# Patient Record
Sex: Female | Born: 2000
Health system: Southern US, Community
[De-identification: ages and names within clinical notes are randomized; demographics above are authoritative.]

## PROBLEM LIST (undated history)

## (undated) ENCOUNTER — Ambulatory Visit (HOSPITAL_COMMUNITY): Admission: EM | Payer: Self-pay | Source: Home / Self Care

## (undated) ENCOUNTER — Ambulatory Visit: Source: Home / Self Care

## (undated) ENCOUNTER — Inpatient Hospital Stay (HOSPITAL_COMMUNITY): Payer: Self-pay

## (undated) DIAGNOSIS — J302 Other seasonal allergic rhinitis: Secondary | ICD-10-CM

## (undated) DIAGNOSIS — Z113 Encounter for screening for infections with a predominantly sexual mode of transmission: Secondary | ICD-10-CM

## (undated) DIAGNOSIS — N939 Abnormal uterine and vaginal bleeding, unspecified: Secondary | ICD-10-CM

## (undated) DIAGNOSIS — B373 Candidiasis of vulva and vagina: Secondary | ICD-10-CM

## (undated) HISTORY — DX: Abnormal uterine and vaginal bleeding, unspecified: N93.9

## (undated) HISTORY — PX: APPENDECTOMY: SHX54

## (undated) HISTORY — DX: Candidiasis of vulva and vagina: B37.3

## (undated) HISTORY — DX: Encounter for screening for infections with a predominantly sexual mode of transmission: Z11.3

---
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
## 2018-01-16 ENCOUNTER — Other Ambulatory Visit: Payer: Self-pay

## 2018-01-16 ENCOUNTER — Ambulatory Visit (INDEPENDENT_AMBULATORY_CARE_PROVIDER_SITE_OTHER): Payer: Medicaid Other | Admitting: Family Medicine

## 2018-01-16 ENCOUNTER — Other Ambulatory Visit (HOSPITAL_COMMUNITY)
Admission: RE | Admit: 2018-01-16 | Discharge: 2018-01-16 | Disposition: A | Discharge status: Home / Self Care | Payer: Medicaid Other | Source: Ambulatory Visit | Attending: Family Medicine | Admitting: Family Medicine

## 2018-01-16 VITALS — BP 100/56 | HR 94 | Temp 98.7°F | Ht 62.0 in | Wt 108.0 lb

## 2018-01-16 DIAGNOSIS — Z3009 Encounter for other general counseling and advice on contraception: Secondary | ICD-10-CM

## 2018-01-16 DIAGNOSIS — Z113 Encounter for screening for infections with a predominantly sexual mode of transmission: Secondary | ICD-10-CM

## 2018-01-16 LAB — POCT WET PREP (WET MOUNT)
Clue Cells Wet Prep Whiff POC: NEGATIVE
TRICHOMONAS WET PREP HPF POC: ABSENT

## 2018-01-16 LAB — POCT URINE PREGNANCY: PREG TEST UR: NEGATIVE

## 2018-01-16 NOTE — Patient Instructions (Signed)
It was a pleasure to see you today! Thank you for choosing [facility] for your primary care. [name] was seen for STD/pregnancy check. Come back to the clinic if you have any routine needs, and go to the emergency room if you have any life threatening symptoms.  Today we ran some checks for infection/pregnancy and gave you some free condoms.  We highly suggest birth control if your goal is not to become pregnant while sexually active.  If we did any lab work today that did not result today, one of two things will happen.  1. If everything is normal, you will get a letter in mail sent to the address in your chart with the results for your records.  It is important to keep your address up to date as that is where we will send results.  2. If the results require some sort of discussion, my nurses or myself will call you on the phone number listed in your records.  It is important to keep your phone number up to date in our system as this is how we will try to reach you.  If we cannot reach you on the phone, we will try to send you a letter in the mail so please enable to voicemail function of your phone.  If you don't hear from [name] in two weeks, please give [name] a call to verify your results. Otherwise, we look forward to seeing you again at your next visit. If you have any questions or concerns before then, please call the clinic at [phone].   Please bring all your medications to every doctors visit   Sign up for My Chart to have easy access to your labs results, and communication with your Primary care physician.     Please check-out at the front desk before leaving the clinic.     Best,  Dr. Marthenia Rolling FAMILY MEDICINE RESIDENT - PGY2 01/16/2018 3:40 PM

## 2018-01-16 NOTE — Progress Notes (Signed)
    Subjective:  [name] is a 17 y.o. female who presents to the [facility] today with a chief complaint of wanting pregnancy test.    HPI: Patient with intermittent barrier protection since stopping depo a few months ago.  Asks about some tenderness in her breasts and wants a pregnancy test.  During our discussion she declined blood HIV but had asked for GC/Chlamydia testing.  She declined to select a BC option but said she was thinking about it and would schedule an appt to discuss again in the future.  She is safe at home, no claim of new rashes/lesions/discharge/bleeding.  She did accept offer of free condoms   Objective:  Physical Exam: *GU performed with CMA, April present at all times BP (!) 100/56   Pulse 94   Temp 98.7 F (37.1 C) (Oral)   Ht 5\' 2"  (1.575 m)   Wt 108 lb (49 kg)   SpO2 99%   BMI 19.75 kg/m   Gen: NAD, resting comfortably CV: RRR with no murmurs appreciated Pulm: NWOB, CTAB with no crackles, wheezes, or rhonchi GI: . Soft, Nontender, Nondistended. MSK: no edema, cyanosis, or clubbing noted GU: no lesions/sores noted, no abnormal discharge, visualized portion of cervix without gross abnormality Skin: warm, dry Neuro: grossly normal, moves all extremities Psych: Normal affect and thought content  Results for orders placed or performed in visit on 01/16/18 (from the past 72 hour(s))  Cervicovaginal ancillary only     Status: None   Collection Time: 01/16/18 12:00 AM  Result Value Ref Range   Chlamydia Negative     Comment: Normal Reference Range - Negative   Neisseria gonorrhea Negative     Comment: Normal Reference Range - Negative  POCT urine pregnancy     Status: None   Collection Time: 01/16/18  3:30 PM  Result Value Ref Range   Preg Test, Ur Negative Negative  POCT Wet Prep [name])     Status: Abnormal   Collection Time: 01/16/18  3:32 PM  Result Value Ref Range   Source Wet Prep POC VAG    WBC, Wet Prep HPF POC NONE    Bacteria Wet Prep HPF  POC Moderate (A) Few   Clue Cells Wet Prep HPF POC None None   Clue Cells Wet Prep Whiff POC Negative Whiff    Yeast Wet Prep HPF POC None None   KOH Wet Prep POC None None   Trichomonas Wet Prep HPF POC Absent Absent     Assessment/Plan:  Screen for STD (sexually transmitted disease) Patient with only intermittent barrier protection, no symptoms. Advised to consistentyl use barrier protection and free condoms given.  Patient declined blood labs  GC/Chlamydia neg, wet prep neg  Contraception management Patient off depo a few months ago, now using intermittent barrier protection.  Declined to select LARC method today but was advised and agreed to try and reschedule appt in the future to discuss further. She did take free condoms today  upreg neg   Marthenia Rolling, DO FAMILY MEDICINE RESIDENT - PGY2 01/18/2018 7:36 AM

## 2018-01-17 LAB — CERVICOVAGINAL ANCILLARY ONLY
CHLAMYDIA, DNA PROBE: NEGATIVE
NEISSERIA GONORRHEA: NEGATIVE

## 2018-01-18 ENCOUNTER — Telehealth: Payer: Self-pay | Admitting: Family Medicine

## 2018-01-18 ENCOUNTER — Telehealth: Payer: Self-pay

## 2018-01-18 NOTE — Assessment & Plan Note (Signed)
Patient with only intermittent barrier protection, no symptoms. Advised to consistentyl use barrier protection and free condoms given.  Patient declined blood labs  GC/Chlamydia neg, wet prep neg

## 2018-01-18 NOTE — Telephone Encounter (Signed)
-----   Message from Marthenia Rolling, DO sent at 01/18/2018  7:26 AM EDT ----- Please tell patient tests were all negative (phone message is ok per patient) and there is no other treatment required.  Please also ask her if she would like to set an appt with any of the doctors here to discuss birth control.

## 2018-01-18 NOTE — Telephone Encounter (Signed)
Spoke to pt and informed her all test were negative. Told her no other treatment is needed. I asked her if she would like to set an appt to discuss birth control, pt said she would call back and schedule an appt. Sunday Spillers, CMA

## 2018-01-18 NOTE — Telephone Encounter (Signed)
She was here on Monday and forgot to get a note for work just saying she was here for an appointment. She will pick it up when ready (please call her at [phone])

## 2018-01-18 NOTE — Assessment & Plan Note (Signed)
Patient off depo a few months ago, now using intermittent barrier protection.  Declined to select LARC method today but was advised and agreed to try and reschedule appt in the future to discuss further. She did take free condoms today  upreg neg

## 2018-01-19 ENCOUNTER — Encounter: Payer: Self-pay | Admitting: Family Medicine

## 2018-01-25 ENCOUNTER — Ambulatory Visit (INDEPENDENT_AMBULATORY_CARE_PROVIDER_SITE_OTHER): Payer: Medicaid Other | Admitting: Family Medicine

## 2018-01-25 ENCOUNTER — Encounter: Payer: Self-pay | Admitting: Family Medicine

## 2018-01-25 ENCOUNTER — Other Ambulatory Visit: Payer: Self-pay

## 2018-01-25 VITALS — BP 92/58 | HR 65 | Temp 98.2°F | Ht 62.0 in | Wt 105.2 lb

## 2018-01-25 DIAGNOSIS — K529 Noninfective gastroenteritis and colitis, unspecified: Secondary | ICD-10-CM

## 2018-01-25 MED ORDER — ONDANSETRON HCL 4 MG PO TABS: 4.0000 mg | ORAL_TABLET | Freq: Three times a day (TID) | ORAL | 0 refills | Status: DC | PRN

## 2018-01-26 ENCOUNTER — Encounter: Payer: Self-pay | Admitting: Family Medicine

## 2018-01-26 NOTE — Progress Notes (Signed)
    CHIEF COMPLAINT / HPI: 2-1/2 days of nausea vomiting with some diarrhea.  Acute onset. Noted after she ate at the sub-shop.  Had acute onset of vomiting.  No blood in her emesis.  Had some mild diarrhea.  No blood in her stool.  Start feeling better yesterday evening and this morning.  Still feels a little weak but no more nausea. REVIEW OF SYSTEMS:  See HPI PERTINENT  PMH / PSH: I have reviewed the patient's medications, allergies, past medical and surgical history, smoking status and updated in the EMR as appropriate.   OBJECTIVE:  Vital signs reviewed. GENERAL: Well-developed, well-nourished, no acute distress. CARDIOVASCULAR: Regular rate and rhythm no murmur gallop or rub LUNGS: Clear to auscultation bilaterally, no rales or wheeze. ABDOMEN: Soft positive bowel sounds NEURO: No gross focal neurological deficits. MSK: Movement of extremity x 4.    ASSESSMENT / PLAN:  #1.  Gastroenteritis, resolving.  We will give her some ondansetron for the mild remaining nausea.  Note for work.  Discussed easing back into diet and continued hydration.  Follow-up PRN.

## 2018-03-15 ENCOUNTER — Ambulatory Visit: Payer: Medicaid Other

## 2018-03-17 ENCOUNTER — Ambulatory Visit: Payer: Medicaid Other | Admitting: Family Medicine

## 2018-03-28 ENCOUNTER — Ambulatory Visit: Payer: Medicaid Other | Admitting: Family Medicine

## 2018-04-03 ENCOUNTER — Other Ambulatory Visit: Payer: Self-pay

## 2018-04-03 ENCOUNTER — Ambulatory Visit (INDEPENDENT_AMBULATORY_CARE_PROVIDER_SITE_OTHER): Payer: BLUE CROSS/BLUE SHIELD | Admitting: Family Medicine

## 2018-04-03 ENCOUNTER — Encounter: Payer: Self-pay | Admitting: Family Medicine

## 2018-04-03 VITALS — BP 100/60 | HR 75 | Temp 98.2°F | Wt 111.0 lb

## 2018-04-03 DIAGNOSIS — Z3009 Encounter for other general counseling and advice on contraception: Secondary | ICD-10-CM

## 2018-04-03 NOTE — Patient Instructions (Signed)
It was a pleasure to see you today! Thank you for choosing [facility] for your primary care. [name] was seen for contreception.  Schedule a return visit for urine pregnancy test. When I negative result, I will prescribe the patch. Between now and then, use condoms and the Emergency Contraceptive.   Best,  Thomes DinningBrad Thompson, MD, MS FAMILY MEDICINE RESIDENT - PGY1 04/03/2018 4:29 PM

## 2018-04-03 NOTE — Progress Notes (Signed)
Established Patient Office Visit  Subjective:  Patient ID: [name], female    DOB: 08/05/2000  Age: 17 y.o. MRN: [id number]  CC: No chief complaint on file.   HPI Cerinity Bice presents to discuss contraception. Previously on depo in May, had irregular periods on the depo. Her last period is the heaviest she has had since before starting Depo-Provera.  Patient is wishes to have contraception to control her menstrual cycle.  She says that she is often very forgetful and does not want to try to use the pill.  She is not interested in IUD or Nexplanon.  She is interested in trying the patch.    Past Medical History:  Diagnosis Date  . Screen for STD (sexually transmitted disease) 02/28/2017  . Seasonal allergies   . Vaginal bleeding 09/17/2016  . Vulvovaginal candidiasis 11/15/2014    Past Surgical History:  Procedure Laterality Date  . LAPAROSCOPIC APPENDECTOMY N/A 11/08/2014   Procedure: APPENDECTOMY LAPAROSCOPIC;  Surgeon: Leonia Corona, MD;  Location: [facility] OR;  Service: Pediatrics;  Laterality: N/A;    Family History  Problem Relation Age of Onset  . Hypertension Mother   . Asthma Sister   . Hypertension Maternal Grandmother   . Arthritis Maternal Grandmother   . Arthritis Maternal Grandfather     Social History   Socioeconomic History  . Marital status: Single    Spouse name: Not on file  . Number of children: Not on file  . Years of education: Not on file  . Highest education level: Not on file  Occupational History  . Not on file  Social Needs  . Financial resource strain: Not on file  . Food insecurity:    Worry: Not on file    Inability: Not on file  . Transportation needs:    Medical: Not on file    Non-medical: Not on file  Tobacco Use  . Smoking status: Never Smoker  . Smokeless tobacco: Never Used  Substance and Sexual Activity  . Alcohol use: No  . Drug use: No  . Sexual activity: Never    Birth control/protection: Abstinence  Lifestyle  .  Physical activity:    Days per week: Not on file    Minutes per session: Not on file  . Stress: Not on file  Relationships  . Social connections:    Talks on phone: Not on file    Gets together: Not on file    Attends religious service: Not on file    Active member of club or organization: Not on file    Attends meetings of clubs or organizations: Not on file    Relationship status: Not on file  . Intimate partner violence:    Fear of current or ex partner: Not on file    Emotionally abused: Not on file    Physically abused: Not on file    Forced sexual activity: Not on file  Other Topics Concern  . Not on file  Social History Narrative  . Not on file    Outpatient Medications Prior to Visit  Medication Sig Dispense Refill  . cetirizine (ZYRTEC) 10 MG tablet Take 1 tablet (10 mg total) by mouth daily as needed for allergies. 30 tablet 3  . fluticasone (FLONASE) 50 MCG/ACT nasal spray Place 2 sprays into both nostrils daily. (Patient taking differently: Place 2 sprays into both nostrils daily as needed. ) 16 g 6  . ondansetron (ZOFRAN) 4 MG tablet Take 1 tablet (4 mg total) by mouth every  8 (eight) hours as needed for nausea or vomiting. 20 tablet 0   No facility-administered medications prior to visit.     Allergies  Allergen Reactions  . Penicillins Rash  . Amoxicillin   . Tramadol     ROS Review of Systems  All other systems reviewed and are negative.     Objective:    Physical Exam  Constitutional: She is oriented to person, place, and time. She appears well-developed and well-nourished. No distress.  HENT:  Head: Normocephalic and atraumatic.  Eyes: Conjunctivae are normal. No scleral icterus.  Neck: No JVD present.  Cardiovascular: Normal rate.  Pulmonary/Chest: Effort normal.  Neurological: She is alert and oriented to person, place, and time.  Skin: Skin is dry.  Psychiatric: She has a normal mood and affect. Her behavior is normal.    BP 100/60    Pulse 75   Temp 98.2 F (36.8 C) (Oral)   Wt 111 lb (50.3 kg)   LMP 03/29/2018   SpO2 99%   BMI 20.30 kg/m  Wt Readings from Last 3 Encounters:  04/03/18 111 lb (50.3 kg) (18 %, Z= -0.90)*  01/25/18 105 lb 3.2 oz (47.7 kg) (10 %, Z= -1.31)*  01/16/18 108 lb (49 kg) (14 %, Z= -1.09)*   * Growth percentiles are based on CDC (Girls, 2-20 Years) data.     Health Maintenance Due  Topic Date Due  . INFLUENZA VACCINE  12/08/2017  . TETANUS/TDAP  02/23/2018    There are no preventive care reminders to display for this patient.  No results found for: TSH Lab Results  Component Value Date   WBC 13.5 11/08/2014   HGB 13.6 11/08/2014   HCT 39.7 11/08/2014   MCV 84.3 11/08/2014   PLT 292 11/08/2014   Lab Results  Component Value Date   NA 138 11/08/2014   K 3.9 11/08/2014   CO2 24 11/08/2014   GLUCOSE 90 11/08/2014   BUN 11 11/08/2014   CREATININE 0.79 11/08/2014   BILITOT 1.0 11/08/2014   ALKPHOS 64 11/08/2014   AST 21 11/08/2014   ALT 11 (L) 11/08/2014   PROT 7.9 11/08/2014   ALBUMIN 4.1 11/08/2014   CALCIUM 9.9 11/08/2014   ANIONGAP 12 11/08/2014   No results found for: CHOL No results found for: HDL No results found for: LDLCALC No results found for: TRIG No results found for: CHOLHDL No results found for: [id number]    Assessment & Plan:  Patient wishes to discuss contraception.  However we do not have any pregnancy test available at the clinic today.  Discussed with patient her options.  She will come back to clinic at her convenience to complete a urine pregnancy test within the next week and when it results negative I will prescribe her the patch.  Discussed other forms of birth control in the interim including condoms and emergency contraception.  Patient is in agreement with this plan. Problem List Items Addressed This Visit      Other   Contraception management - Primary   Relevant Orders   POCT urine pregnancy      No orders of the defined types were  placed in this encounter.   Follow-up: No follow-ups on file.    Garnette GunnerAaron B Imanol Bihl, MD

## 2018-04-04 ENCOUNTER — Encounter: Payer: Self-pay | Admitting: Family Medicine

## 2018-04-04 ENCOUNTER — Other Ambulatory Visit (INDEPENDENT_AMBULATORY_CARE_PROVIDER_SITE_OTHER): Payer: BLUE CROSS/BLUE SHIELD

## 2018-04-04 ENCOUNTER — Other Ambulatory Visit: Payer: Self-pay | Admitting: *Deleted

## 2018-04-04 DIAGNOSIS — Z3202 Encounter for pregnancy test, result negative: Secondary | ICD-10-CM | POA: Diagnosis not present

## 2018-04-04 DIAGNOSIS — Z3009 Encounter for other general counseling and advice on contraception: Secondary | ICD-10-CM

## 2018-04-04 LAB — POCT URINE PREGNANCY: Preg Test, Ur: NEGATIVE

## 2018-04-04 MED ORDER — NORELGESTROMIN-ETH ESTRADIOL 150-35 MCG/24HR TD PTWK: 1.0000 | MEDICATED_PATCH | TRANSDERMAL | 12 refills | Status: DC

## 2018-04-04 NOTE — Telephone Encounter (Signed)
Phoned in Rx of Ortho Evra per Dr. Janee Mornhompson. [name], [name], [place]

## 2018-04-04 NOTE — Addendum Note (Signed)
Addended by: Fanny BienHOMPSON, Xee Hollman B on: 04/04/2018 12:31 PM   Modules accepted: Orders

## 2018-04-04 NOTE — Progress Notes (Signed)
upreg neg. Will rx bc patch.

## 2018-04-04 NOTE — Progress Notes (Signed)
Pt had neg upreg. Will Rx Ortho evra patch.

## 2018-04-05 ENCOUNTER — Telehealth: Payer: Self-pay

## 2018-04-05 NOTE — Telephone Encounter (Signed)
[facility] Xulane patch. Clinical info submitted via CoverMyMeds. Response may take up to 72 hours.  Key # [id number]  Ples SpecterAlisa , RN [facility] RN)

## 2018-04-26 ENCOUNTER — Ambulatory Visit: Payer: BLUE CROSS/BLUE SHIELD | Admitting: Family Medicine

## 2018-05-18 ENCOUNTER — Ambulatory Visit (INDEPENDENT_AMBULATORY_CARE_PROVIDER_SITE_OTHER): Payer: BLUE CROSS/BLUE SHIELD | Admitting: Student in an Organized Health Care Education/Training Program

## 2018-05-18 ENCOUNTER — Other Ambulatory Visit (HOSPITAL_COMMUNITY)
Admission: RE | Admit: 2018-05-18 | Discharge: 2018-05-18 | Disposition: A | Discharge status: Home / Self Care | Payer: BLUE CROSS/BLUE SHIELD | Source: Ambulatory Visit | Attending: Family Medicine | Admitting: Family Medicine

## 2018-05-18 ENCOUNTER — Other Ambulatory Visit: Payer: Self-pay

## 2018-05-18 VITALS — BP 96/64 | HR 80 | Temp 98.6°F | Ht 62.0 in | Wt 114.8 lb

## 2018-05-18 DIAGNOSIS — N760 Acute vaginitis: Secondary | ICD-10-CM | POA: Insufficient documentation

## 2018-05-18 DIAGNOSIS — N898 Other specified noninflammatory disorders of vagina: Secondary | ICD-10-CM | POA: Insufficient documentation

## 2018-05-18 DIAGNOSIS — B9689 Other specified bacterial agents as the cause of diseases classified elsewhere: Secondary | ICD-10-CM | POA: Diagnosis not present

## 2018-05-18 DIAGNOSIS — Z3202 Encounter for pregnancy test, result negative: Secondary | ICD-10-CM

## 2018-05-18 LAB — POCT WET PREP (WET MOUNT)
Clue Cells Wet Prep Whiff POC: POSITIVE
Trichomonas Wet Prep HPF POC: ABSENT

## 2018-05-18 LAB — POCT URINE PREGNANCY: Preg Test, Ur: NEGATIVE

## 2018-05-18 MED ORDER — METRONIDAZOLE 500 MG PO TABS: 500.0000 mg | ORAL_TABLET | Freq: Two times a day (BID) | ORAL | 0 refills | Status: DC

## 2018-05-18 NOTE — Assessment & Plan Note (Signed)
1. Bacterial vaginosis Wet prep positive for few yeast and BV. Will treat BV. If symptoms do not improve over the next week, patient to call back and would prescribe diflucan for yeast. Upreg negative. STI screening pending with GC/Chla. Patient refuses RPR and HIV. - metroNIDAZOLE (FLAGYL) 500 MG tablet; Take 1 tablet (500 mg total) by mouth 2 (two) times daily.  Dispense: 14 tablet; Refill: 0

## 2018-05-18 NOTE — Patient Instructions (Signed)
It was a pleasure seeing you today in our clinic.  Here is the treatment plan we have discussed and agreed upon together:  Please complete 1 week course of antibiotics.  If your symptoms worsen or fail to improve, give [name] a call.  We tested you for gonorrhea and chlamydia at today's visit. I will call or send you a letter with these results. If you do not hear from me within the next week, please give our office a call.  Our clinic's number is [phone]. Please call with questions or concerns about what we discussed today.  Be well, Dr. Mosetta Putt

## 2018-05-18 NOTE — Progress Notes (Signed)
   CC: vaginal discharge  HPI: [name] is a 18 y.o. female   VAGINAL DISCHARGE  Having vaginal discharge for 2-3 days. Discharge consistency: clumpy Discharge color: white Medications tried: none  Recent antibiotic use: none Sex in last month: yes Possible STD exposure:no, but would like testing  Symptoms Fever: no Dysuria:no Vaginal bleeding: last meneses started 12/23 Abdomen or Pelvic pain: no Back pain: no Genital sores or ulcers:no Rash: no Pain during sex: no Missed menstrual period: regular periods  ROS see HPI Smoking Status noted   Review of Symptoms:  See HPI for ROS.   CC, SH/smoking status, and VS noted.  Objective: BP 96/64   Pulse 80   Temp 98.6 F (37 C) (Oral)   Ht 5\' 2"  (1.575 m)   Wt 114 lb 12.8 oz (52.1 kg)   LMP 05/01/2018 (Approximate)   SpO2 99%   BMI 21.00 kg/m  GEN: NAD, alert, cooperative, and pleasant. RESPIRATORY: Comfortable work of breathing, speaks in full sentences CV: Regular rate noted, distal extremities well perfused and warm without edema GI: Soft, nondistended SKIN: warm and dry, no rashes or lesions NEURO: II-XII grossly intact MSK: Moves 4 extremities equally PSYCH: AAOx3, appropriate affect Female genitalia: normal external genitalia, vulva, vagina, cervix, uterus and adnexa, thin white discharge, Cervix is very posterior  Assessment and plan:  Bacterial vaginosis 1. Bacterial vaginosis Wet prep positive for few yeast and BV. Will treat BV. If symptoms do not improve over the next week, patient to call back and would prescribe diflucan for yeast. Upreg negative. STI screening pending with GC/Chla. Patient refuses RPR and HIV. - metroNIDAZOLE (FLAGYL) 500 MG tablet; Take 1 tablet (500 mg total) by mouth 2 (two) times daily.  Dispense: 14 tablet; Refill: 0   Orders Placed This Encounter  Procedures  . POCT Wet Prep [employer])  . POCT urine pregnancy    Meds ordered this encounter  Medications  .  metroNIDAZOLE (FLAGYL) 500 MG tablet    Sig: Take 1 tablet (500 mg total) by mouth 2 (two) times daily.    Dispense:  14 tablet    Refill:  0     Howard Pouch, MD,MS,  PGY3 05/18/2018 11:23 AM

## 2018-05-19 LAB — CERVICOVAGINAL ANCILLARY ONLY
CHLAMYDIA, DNA PROBE: NEGATIVE
Neisseria Gonorrhea: NEGATIVE

## 2018-06-14 ENCOUNTER — Telehealth: Payer: Self-pay

## 2018-06-14 NOTE — Telephone Encounter (Signed)
Patient called and stated her BV symptoms got better after course of Flagyl for about 3-4 days and then came back.  OV note mentions if symptoms return, would call in Diflucan for yeast.  Describes symptoms as greenish white discharge, no itching or burning, some odor. Worse after intercourse.   Rx or office visit?  Call back is [phone]

## 2018-06-16 NOTE — Telephone Encounter (Signed)
I would have her come back for another office visit because her symptoms improved with flagyl, it may be that she is still having BV.

## 2018-06-19 NOTE — Telephone Encounter (Signed)
Patient returned call. Appointment made for tomorrow. Ples SpecterAlisa Nataliee Shurtz, RN [facility] RN)

## 2018-06-19 NOTE — Telephone Encounter (Signed)
LVM to call office back to inform her of below and assist in getting her an appointment scheduled. Lamonte Sakai, April D, [place]

## 2018-06-20 ENCOUNTER — Ambulatory Visit: Payer: BLUE CROSS/BLUE SHIELD | Admitting: Family Medicine

## 2018-07-12 ENCOUNTER — Ambulatory Visit
Admission: EM | Admit: 2018-07-12 | Discharge: 2018-07-12 | Disposition: A | Discharge status: Home / Self Care | Payer: BLUE CROSS/BLUE SHIELD | Attending: Physician Assistant | Admitting: Physician Assistant

## 2018-07-12 DIAGNOSIS — R05 Cough: Secondary | ICD-10-CM

## 2018-07-12 DIAGNOSIS — R0981 Nasal congestion: Secondary | ICD-10-CM | POA: Diagnosis not present

## 2018-07-12 DIAGNOSIS — R6889 Other general symptoms and signs: Secondary | ICD-10-CM

## 2018-07-12 DIAGNOSIS — J3489 Other specified disorders of nose and nasal sinuses: Secondary | ICD-10-CM

## 2018-07-12 DIAGNOSIS — R110 Nausea: Secondary | ICD-10-CM

## 2018-07-12 DIAGNOSIS — R51 Headache: Secondary | ICD-10-CM

## 2018-07-12 MED ORDER — IPRATROPIUM BROMIDE 0.06 % NA SOLN: 2.0000 | Freq: Four times a day (QID) | NASAL | 12 refills | Status: DC

## 2018-07-12 MED ORDER — PREDNISONE 50 MG PO TABS: 50.0000 mg | ORAL_TABLET | Freq: Every day | ORAL | 0 refills | Status: DC

## 2018-07-12 MED ORDER — BENZONATATE 100 MG PO CAPS: 100.0000 mg | ORAL_CAPSULE | Freq: Three times a day (TID) | ORAL | 0 refills | Status: DC

## 2018-07-12 NOTE — ED Provider Notes (Signed)
[facility]    CSN: [id number] Arrival date & time: 07/12/18  1341     History   Chief Complaint Chief Complaint  Patient presents with  . Influenza    HPI [name] is a 18 y.o. female.   18 year old female comes in for 3-day history of URI symptoms.  Has had rhinorrhea, nasal congestion, nausea, headache, sneezing, cough, body aches, chills.  No obvious fever.  Denies vomiting, abdominal pain.  Has had a few episodes of loose stools.  Still been able to tolerate fluids without difficulty.  OTC cold medication without relief.  Never smoker.  Positive sick contact.     Past Medical History:  Diagnosis Date  . Screen for STD (sexually transmitted disease) 02/28/2017  . Seasonal allergies   . Vaginal bleeding 09/17/2016  . Vulvovaginal candidiasis 11/15/2014    Patient Active Problem List   Diagnosis Date Noted  . Bacterial vaginosis 05/18/2018  . Contraception management 11/28/2017  . Vaginal discharge 09/30/2017  . High risk sexual behavior 03/10/2017  . Screen for STD (sexually transmitted disease) 02/28/2017    Past Surgical History:  Procedure Laterality Date  . LAPAROSCOPIC APPENDECTOMY N/A 11/08/2014   Procedure: APPENDECTOMY LAPAROSCOPIC;  Surgeon: Leonia Corona, MD;  Location: [facility] OR;  Service: Pediatrics;  Laterality: N/A;    OB History   No obstetric history on file.      Home Medications    Prior to Admission medications   Medication Sig Start Date End Date Taking? Authorizing Provider  benzonatate (TESSALON) 100 MG capsule Take 1 capsule (100 mg total) by mouth every 8 (eight) hours. 07/12/18   Cathie Hoops, Khelani Kops V, PA-C  cetirizine (ZYRTEC) 10 MG tablet Take 1 tablet (10 mg total) by mouth daily as needed for allergies. 11/25/17   Latrelle Dodrill, MD  ipratropium (ATROVENT) 0.06 % nasal spray Place 2 sprays into both nostrils 4 (four) times daily. 07/12/18   Cathie Hoops, Brianny Soulliere V, PA-C  norelgestromin-ethinyl estradiol (ORTHO EVRA) 150-35 MCG/24HR  transdermal patch Place 1 patch onto the skin once a week. 04/04/18   Garnette Gunner, MD  ondansetron (ZOFRAN) 4 MG tablet Take 1 tablet (4 mg total) by mouth every 8 (eight) hours as needed for nausea or vomiting. 01/25/18   Nestor Ramp, MD  predniSONE (DELTASONE) 50 MG tablet Take 1 tablet (50 mg total) by mouth daily with breakfast. 07/12/18   Belinda Fisher, PA-C    Family History Family History  Problem Relation Age of Onset  . Hypertension Mother   . Asthma Sister   . Hypertension Maternal Grandmother   . Arthritis Maternal Grandmother   . Arthritis Maternal Grandfather     Social History Social History   Tobacco Use  . Smoking status: Never Smoker  . Smokeless tobacco: Never Used  Substance Use Topics  . Alcohol use: No  . Drug use: No     Allergies   Penicillins; Amoxicillin; and Tramadol   Review of Systems Review of Systems  Reason unable to perform ROS: See HPI as above.     Physical Exam Triage Vital Signs ED Triage Vitals  Enc Vitals Group     BP 07/12/18 1352 (!) 157/85     Pulse Rate 07/12/18 1352 81     Resp 07/12/18 1352 18     Temp 07/12/18 1352 98.1 F (36.7 C)     Temp Source 07/12/18 1352 Oral     SpO2 07/12/18 1352 98 %     Weight --  Height --      Head Circumference --      Peak Flow --      Pain Score 07/12/18 1353 10     Pain Loc --      Pain Edu? --      Excl. in GC? --    No data found.  Updated Vital Signs BP (!) 157/85 (BP Location: Left Arm)   Pulse 81   Temp 98.1 F (36.7 C) (Oral)   Resp 18   LMP 07/09/2018   SpO2 98%   Physical Exam Constitutional:      General: She is not in acute distress.    Appearance: She is well-developed. She is not ill-appearing, toxic-appearing or diaphoretic.  HENT:     Head: Normocephalic and atraumatic.     Right Ear: Tympanic membrane, ear canal and external ear normal. Tympanic membrane is not erythematous or bulging.     Left Ear: Tympanic membrane, ear canal and external ear  normal. Tympanic membrane is not erythematous or bulging.     Nose: Rhinorrhea present.     Right Sinus: Maxillary sinus tenderness and frontal sinus tenderness present.     Left Sinus: Maxillary sinus tenderness and frontal sinus tenderness present.     Mouth/Throat:     Mouth: Mucous membranes are moist.     Pharynx: Oropharynx is clear. Uvula midline.  Eyes:     Conjunctiva/sclera: Conjunctivae normal.     Pupils: Pupils are equal, round, and reactive to light.  Neck:     Musculoskeletal: Normal range of motion and neck supple.  Cardiovascular:     Rate and Rhythm: Normal rate and regular rhythm.     Heart sounds: Normal heart sounds. No murmur. No friction rub. No gallop.   Pulmonary:     Effort: Pulmonary effort is normal. No accessory muscle usage, prolonged expiration, respiratory distress or retractions.     Breath sounds: Normal breath sounds. No stridor, decreased air movement or transmitted upper airway sounds. No decreased breath sounds, wheezing, rhonchi or rales.  Skin:    General: Skin is warm and dry.  Neurological:     Mental Status: She is alert and oriented to person, place, and time.      UC Treatments / Results  Labs (all labs ordered are listed, but only abnormal results are displayed) Labs Reviewed - No data to display  EKG None  Radiology No results found.  Procedures Procedures (including critical care time)  Medications Ordered in UC Medications - No data to display  Initial Impression / Assessment and Plan / UC Course  I have reviewed the triage vital signs and the nursing notes.  Pertinent labs & imaging results that were available during my care of the patient were reviewed by me and considered in my medical decision making (see chart for details).    Discussed flulike symptoms outside of treatment range of Tamiflu.  Will provide symptomatic treatment.  Prednisone as directed for sinus pressure.  Other symptomatic treatment discussed.   Offered Zofran given complaint of nausea, patient deferred for now stating able to tolerate fluids without difficulty.  Push fluids.  Return precautions given.  Patient expresses understanding and agrees to plan.  Final Clinical Impressions(s) / UC Diagnoses   Final diagnoses:  Flu-like symptoms    ED Prescriptions    Medication Sig Dispense Auth. Provider   predniSONE (DELTASONE) 50 MG tablet Take 1 tablet (50 mg total) by mouth daily with breakfast. 5 tablet Belinda Fisher,  PA-C   ipratropium (ATROVENT) 0.06 % nasal spray Place 2 sprays into both nostrils 4 (four) times daily. 15 mL Choua Chalker V, PA-C   benzonatate (TESSALON) 100 MG capsule Take 1 capsule (100 mg total) by mouth every 8 (eight) hours. 21 capsule Threasa Alpha, [place] 07/12/18 1434

## 2018-07-12 NOTE — ED Triage Notes (Signed)
Pt c/o headaches, body aches, chills and cough x2days

## 2018-07-12 NOTE — Discharge Instructions (Signed)
Tessalon for cough. Prednisone for sinus pressure. Start atrovent nasal spray for nasal congestion/drainage. You can use over the counter nasal saline rinse such as neti pot for nasal congestion. Keep hydrated, your urine should be clear to pale yellow in color. Tylenol/motrin for fever and pain. Monitor for any worsening of symptoms, chest pain, shortness of breath, wheezing, swelling of the throat, follow up for reevaluation.

## 2018-12-18 ENCOUNTER — Other Ambulatory Visit (HOSPITAL_COMMUNITY)
Admission: RE | Admit: 2018-12-18 | Discharge: 2018-12-18 | Disposition: A | Discharge status: Home / Self Care | Payer: Medicaid Other | Source: Ambulatory Visit | Attending: Family Medicine | Admitting: Family Medicine

## 2018-12-18 ENCOUNTER — Ambulatory Visit (INDEPENDENT_AMBULATORY_CARE_PROVIDER_SITE_OTHER): Payer: Self-pay | Admitting: Family Medicine

## 2018-12-18 ENCOUNTER — Encounter: Payer: Self-pay | Admitting: Family Medicine

## 2018-12-18 ENCOUNTER — Other Ambulatory Visit: Payer: Self-pay

## 2018-12-18 VITALS — BP 102/72 | HR 96 | Wt 111.0 lb

## 2018-12-18 DIAGNOSIS — R109 Unspecified abdominal pain: Secondary | ICD-10-CM

## 2018-12-18 DIAGNOSIS — Z113 Encounter for screening for infections with a predominantly sexual mode of transmission: Secondary | ICD-10-CM | POA: Diagnosis not present

## 2018-12-18 DIAGNOSIS — B3731 Acute candidiasis of vulva and vagina: Secondary | ICD-10-CM

## 2018-12-18 DIAGNOSIS — K5900 Constipation, unspecified: Secondary | ICD-10-CM

## 2018-12-18 DIAGNOSIS — B373 Candidiasis of vulva and vagina: Secondary | ICD-10-CM

## 2018-12-18 DIAGNOSIS — Z30016 Encounter for initial prescription of transdermal patch hormonal contraceptive device: Secondary | ICD-10-CM

## 2018-12-18 DIAGNOSIS — N912 Amenorrhea, unspecified: Secondary | ICD-10-CM

## 2018-12-18 LAB — POCT WET PREP (WET MOUNT)
Clue Cells Wet Prep Whiff POC: NEGATIVE
Trichomonas Wet Prep HPF POC: ABSENT

## 2018-12-18 LAB — POCT URINE PREGNANCY: Preg Test, Ur: NEGATIVE

## 2018-12-18 MED ORDER — NORELGESTROMIN-ETH ESTRADIOL 150-35 MCG/24HR TD PTWK: 1.0000 | MEDICATED_PATCH | TRANSDERMAL | 12 refills | Status: DC

## 2018-12-18 MED ORDER — FLUCONAZOLE 150 MG PO TABS: 150.0000 mg | ORAL_TABLET | Freq: Once | ORAL | 0 refills | Status: AC

## 2018-12-18 NOTE — Progress Notes (Signed)
Subjective:   Patient ID: [name]    DOB: 04/23/2001, 18 y.o. female   MRN: [id number]  [name] is a 18 y.o. female here for abdominal pain.  Abdominal Pain: Left lower abdominal pain x 1 week, particularly during or after intercourse, lasts for ~2 minutes. Nothing she does makes get better or worse. Hard to explain the feeling. Describes it as a dull throbbing pain. Does not feel like cramps. Does note some "upset stomach" with nausea all the time, more sensitive to smells. Denies vomiting. Denies fevers or chills, diarrhea. Denies any vaginal bleeding. Endorses frequent yeast infections but no new abnormal discharge. Just occasional white discharge. Not currently on birth control. Sexually active with one partner x 2 years. Only occasional use of condoms. LMP 10/30/18. Normally has regular periods every month, lasts 7 days, "heavy" - uses 2-3 tampons per day. Notes chronic constipation - last bowel movement yesterday which was hard pellets. She notes she has bowel movement 5-6x/month. Occasional hard stools. Notes decreased appetite. Denies excessive hot/cold, hair loss. Does endorse feeling tired but attributes this to working two jobs. Denies dysuria. She notes she drinks 1 bottle of water and 1 glass of juice a day.  Review of Systems:  Per HPI.   [place], medications and smoking status reviewed.  Objective:   BP: 102/72, pulse 96, weight 111 lb (50.3 kg), last menstrual period 10/30/2018, SpO2 98 %. Vitals and nursing note reviewed.  General: well nourished, well developed, in no acute distress with non-toxic appearance, sitting comfortably on exam bed HEENT: normocephalic, atraumatic, moist mucous membranes CV: regular rate and rhythm without murmurs, rubs, or gallops, no lower extremity edema, 2+ radial and pedal pulses bilaterally Lungs: clear to auscultation bilaterally with normal work of breathing on room air Abdomen: soft, tender to palpation along left lower quadrant and  pelvis otherwise non-tender, non-distended, no masses or organomegaly palpable, able to palpate firm stool in LLQ, minimal bowel sounds throughout Skin: warm, dry, no rashes or lesions Extremities: warm and well perfused Neuro: Alert and oriented, speech normal Pelvic exam: VULVA: normal appearing vulva with no masses, tenderness or lesions, VAGINA: no atrophy or erythema, vaginal discharge - white, copious, odorless and thick, CERVIX: normal appearing cervix without discharge or lesions, UTERUS: uterus is normal size, shape, consistency and nontender, ADNEXA: normal adnexa in size and no masses, tenderness left, WET MOUNT done - results: KOH done, hyphae, exam chaperoned by Delray Alt.  Assessment & Plan:   Abdominal pain in female Acute LLQ pain x 1 week that occurs with sexual intercourse. Differential includes and may be multifactorial: constipation vs left ovarian cyst vs possibly referred pain from current yeast infection. Less likely ruptured ovarian cyst or ovarian torsion given description of pain. Pelvic exam and wet prep notable for yeast infection. No systemic infectious symptoms or signs of PID on exam. Pregnancy test negative. Discussed next steps including treating yeast infection and constipation vs pelvic ultrasound. Patient opted for conservative treatment with plan for ultrasound if no improvement. - Recommend Miralax BID until soft stool then QD thereafter - 48-64 oz of water/day, increase fiber intake - If no improvement in 1 week, RTC for non-OB Pelvic ultrasound with transvaginal  - Return precautions discussed including worsening pain, fevers/chills, abnormal bleeding, or worsening of vaginal discharge  Contraception management Patient currently using intermittent barrier protection. Urine preg test negative. Discussed contraceptive options including Depo vs LARC vs pill vs patch. Patient has used Depo in the past but had unwanted side  effects. Not interested in LARC or  Pills. Patient open to trying transdermal patch. - Rx for Ortho-evra patch - Discussed instructions of use and possible adverse effects including skin sensitivity. Patient understood and agrees to plan.   Vaginal candidiasis Pelvic exam and wet prep notable for yeast infection. Trich and BV negative. Informed patient this is common in sexually active females. Patient reports recurrent and frequent yeast infections with several OTC treatments with Monistat this year. May benefit from maintenance treatment in the future if continues to recur - rx for Diflucan 150mg  x 1  - If continues to recur, consider maintenance treatment: Induction with fluconazole 150mg  q 72 hs x 3 doses, followed by maintenance fluconazole 150 mg once per week for six months - RTC if no improvement or worsening symptoms  Orders Placed This Encounter  Procedures  . POCT Wet Prep [employer](Wet Mount)  . POCT urine pregnancy   Meds ordered this encounter  Medications  . norelgestromin-ethinyl estradiol (ORTHO EVRA) 150-35 MCG/24HR transdermal patch    Sig: Place 1 patch onto the skin once a week.    Dispense:  3 patch    Refill:  12  . fluconazole (DIFLUCAN) 150 MG tablet    Sig: Take 1 tablet (150 mg total) by mouth once for 1 dose.    Dispense:  1 tablet    Refill:  0    Orpah CobbKiersten , DO PGY-2, [facility] 12/19/2018 9:48 AM

## 2018-12-18 NOTE — Patient Instructions (Addendum)
Thank you so much for coming in to see me today.  There may be multiple causes for left lower abdominal pain. I recommend Miralax twice a day until you have a regular soft bowel movement then you can take it once a day. I recommend you drink 4-6 bottles of water a day.   I have also sent in a prescription for Diflucan for your yeast infection.  We also discussed contraception and we decided to try the Ortho Vera Patch. I will call that into your pharmacy. You apply a patch 1 time per week for 3 weeks then take 1 week off. Then start again.  Please follow up in 2 weeks or sooner if no improvement. We can get a pelvic ultrasound at that time.  Thank you and feel free to call the clinic with any concerns.  Take care, Dr. Tarry Kos

## 2018-12-19 ENCOUNTER — Encounter: Payer: Self-pay | Admitting: Family Medicine

## 2018-12-19 DIAGNOSIS — R1084 Generalized abdominal pain: Secondary | ICD-10-CM | POA: Insufficient documentation

## 2018-12-19 NOTE — Assessment & Plan Note (Addendum)
Pelvic exam and wet prep notable for yeast infection. Trich and BV negative. Informed patient this is common in sexually active females. Patient reports recurrent and frequent yeast infections with several OTC treatments with Monistat this year. May benefit from maintenance treatment in the future if continues to recur - rx for Diflucan 150mg  x 1  - If continues to recur, consider maintenance treatment: Induction with fluconazole 150mg  q 72 hs x 3 doses, followed by maintenance fluconazole 150 mg once per week for six months - RTC if no improvement or worsening symptoms

## 2018-12-19 NOTE — Assessment & Plan Note (Signed)
Acute LLQ pain x 1 week that occurs with sexual intercourse. Differential includes and may be multifactorial: constipation vs left ovarian cyst vs possibly referred pain from current yeast infection. Less likely ruptured ovarian cyst or ovarian torsion given description of pain. Pelvic exam and wet prep notable for yeast infection. No systemic infectious symptoms or signs of PID on exam. Pregnancy test negative. Discussed next steps including treating yeast infection and constipation vs pelvic ultrasound. Patient opted for conservative treatment with plan for ultrasound if no improvement. - Recommend Miralax BID until soft stool then QD thereafter - 48-64 oz of water/day, increase fiber intake - If no improvement in 1 week, RTC for non-OB Pelvic ultrasound with transvaginal  - Return precautions discussed including worsening pain, fevers/chills, abnormal bleeding, or worsening of vaginal discharge

## 2018-12-19 NOTE — Assessment & Plan Note (Addendum)
Patient currently using intermittent barrier protection. Urine preg test negative. Discussed contraceptive options including Depo vs LARC vs pill vs patch. Patient has used Depo in the past but had unwanted side effects. Not interested in LARC or Pills. Patient open to trying transdermal patch. - Rx for Ortho-evra patch - Discussed instructions of use and possible adverse effects including skin sensitivity. Patient understood and agrees to plan.

## 2018-12-20 ENCOUNTER — Telehealth: Payer: Self-pay | Admitting: Family Medicine

## 2018-12-20 LAB — CERVICOVAGINAL ANCILLARY ONLY
Chlamydia: NEGATIVE
Neisseria Gonorrhea: NEGATIVE

## 2018-12-20 NOTE — Telephone Encounter (Signed)
Called patient. Having dysuria, still vaginal discharge. Dysuria started yesterday. UA not obtained at visit the other day. Will have her come for appointment to assess. Visit scheduled with me in [facility] clinic tomorrow at [place]. Leeanne Rio, MD

## 2018-12-20 NOTE — Telephone Encounter (Signed)
Pt would like to speak to Dr. Ardelia Mems. She was just seen and was told that if she wasn't feeling better to call the office. jw

## 2018-12-21 ENCOUNTER — Encounter: Payer: Self-pay | Admitting: Family Medicine

## 2018-12-21 ENCOUNTER — Other Ambulatory Visit: Payer: Self-pay

## 2018-12-21 ENCOUNTER — Ambulatory Visit (INDEPENDENT_AMBULATORY_CARE_PROVIDER_SITE_OTHER): Payer: Self-pay | Admitting: Family Medicine

## 2018-12-21 VITALS — BP 90/60 | HR 84

## 2018-12-21 DIAGNOSIS — R300 Dysuria: Secondary | ICD-10-CM

## 2018-12-21 LAB — POCT URINALYSIS DIP (MANUAL ENTRY)
Bilirubin, UA: NEGATIVE
Blood, UA: NEGATIVE
Glucose, UA: NEGATIVE mg/dL
Nitrite, UA: NEGATIVE
Protein Ur, POC: NEGATIVE mg/dL
Spec Grav, UA: >=1.03 — AB (ref 1.010–1.025)
Urobilinogen, UA: 0.2 E.U./dL
pH, UA: 6 (ref 5.0–8.0)

## 2018-12-21 LAB — POCT UA - MICROSCOPIC ONLY: Epithelial cells, urine per micros: >20

## 2018-12-21 MED ORDER — FLUCONAZOLE 150 MG PO TABS: ORAL_TABLET | ORAL | 0 refills | Status: DC

## 2018-12-21 MED ORDER — CETIRIZINE HCL 10 MG PO TABS: 10.0000 mg | ORAL_TABLET | Freq: Every day | ORAL | 3 refills | Status: DC | PRN

## 2018-12-21 MED ORDER — NITROFURANTOIN MONOHYD MACRO 100 MG PO CAPS: 100.0000 mg | ORAL_CAPSULE | Freq: Two times a day (BID) | ORAL | 0 refills | Status: AC

## 2018-12-21 MED ORDER — MICONAZOLE NITRATE 2 % VA CREA: 1.0000 | TOPICAL_CREAM | Freq: Every day | VAGINAL | 0 refills | Status: DC

## 2018-12-21 NOTE — Progress Notes (Signed)
Date of Visit: 12/21/2018   HPI:  Patient presents for a same day appointment to discuss dysuria and vaginal discharge.  Seen 3 days ago by Dr. Tarry Kos for LLQ pain, found to have yeast infection and treated with diflucan x1. Given rx for miralax and advised to start this for constipation, but patient has not yet begun taking it. She did have a bowel movement yesterday with no blood.   Yesterday began having burning with urination and also vulvar itching. Is urinating more often than normal. Occasional hesitancy. Reports multiple prior episodes of yeast infections which she treated with over the counter monistat. Has never had a urinary tract infection in the past. No fevers. Eating and drinking well.   Requests refill of zyrtec which she uses for seasonal allergies. Was given rx for ortho evra patch by Dr. Tarry Kos. Has not yet started it. Denies history of migraine with aura.  ROS: See HPI  [place]: no significant past medical hx  PHYSICAL EXAM: BP 90/60   Pulse 84   SpO2 99%  Gen: no acute distress, pleasant ,cooperative, well appearing Lungs: normal work of breathing  Abdomen: soft. Mild tenderness to palpation in LLQ, some suprapubic tenderness. No masses. No organomegaly Neuro: alert, grossly nonfocal, speech normal Back: no CVA tenderness  ASSESSMENT/PLAN:  Candidiasis Not improved with diflucan 150mg  x1. Repeat dose today, plus add monistat 7. Given two additional doses to take at the end of her antibiotic course.  UTI Symptoms of dysuria and frequency. UA with +leuks. History of PCN allergy so will treat with macrobid x5 days Send urine for culture given allergy. Advised patient if LLQ pain does not improve with treatment of urine within a week to call back and we can schedule her for a pelvic u/s.  Seasonal allergies Refill zyrtec  FOLLOW UP: Follow up as needed if symptoms worsen or fail to improve.    [place]. Ardelia Mems, [place]

## 2018-12-21 NOTE — Patient Instructions (Addendum)
For yeast: - take one more diflucan pill today - start monistat 7 day course - sent this in  For urine: - start antibiotic (nitrofurantoin) - sent in two additional diflucan pills for yeast - take the first one when you finish antibiotics, and the second one 3 days later.  Refilled zyrtec  Do the miralax as instructed by Dr. Tarry Kos If your pain is not improved within 1 week please call the clinic and we will order a pelvic ultrasound.  Be well, Dr. Ardelia Mems

## 2018-12-23 LAB — URINE CULTURE

## 2018-12-26 ENCOUNTER — Telehealth: Payer: Self-pay | Admitting: *Deleted

## 2018-12-26 NOTE — Telephone Encounter (Signed)
Pt calls for medication questions.  She started her period yesterday and is not sure she should continue her medications.  1. macrobid for UTI - Advised she should continue this  2. Diflucan and miconazole cream - Advised I would send a message to MD about these.

## 2018-12-27 NOTE — Telephone Encounter (Signed)
Called patient and explained to her previous note instructions on medication per Dr. Ardelia Mems.  Patient verbalizes understanding.  [name], [place]

## 2018-12-27 NOTE — Telephone Encounter (Signed)
Red team, please let patient know the following: Continue macrobid and diflucan.  Stop the miconazole cream until period has ended. If still having discharge/itching can resume miconazole at that time.  Thanks Leeanne Rio, MD

## 2019-02-06 ENCOUNTER — Other Ambulatory Visit: Payer: Self-pay

## 2019-02-06 ENCOUNTER — Ambulatory Visit (INDEPENDENT_AMBULATORY_CARE_PROVIDER_SITE_OTHER): Payer: Self-pay | Admitting: Family Medicine

## 2019-02-06 ENCOUNTER — Encounter: Payer: Self-pay | Admitting: Family Medicine

## 2019-02-06 VITALS — BP 92/60 | HR 87 | Wt 115.6 lb

## 2019-02-06 DIAGNOSIS — R1084 Generalized abdominal pain: Secondary | ICD-10-CM

## 2019-02-06 LAB — POCT URINALYSIS DIP (MANUAL ENTRY)
Bilirubin, UA: NEGATIVE
Blood, UA: NEGATIVE
Glucose, UA: NEGATIVE mg/dL
Leukocytes, UA: NEGATIVE
Nitrite, UA: NEGATIVE
Protein Ur, POC: NEGATIVE mg/dL
Spec Grav, UA: >=1.03 — AB (ref 1.010–1.025)
Urobilinogen, UA: 0.2 E.U./dL
pH, UA: 6.5 (ref 5.0–8.0)

## 2019-02-06 LAB — POCT URINE PREGNANCY: Preg Test, Ur: NEGATIVE

## 2019-02-06 NOTE — Patient Instructions (Addendum)
Thank you for coming to see me today. It was a pleasure. Today we talked about:   Your stomach pain: Take miralax twice a day until you have a bowel movement, then decrease to daily.  Increase your water and fiber intake to promote good bowel movement cycle.  Try Maalox over the counter as well for the pain.  Please follow-up with me in 1 week.  If you have any questions or concerns, please do not hesitate to call the office at [phone].  Best,   [place] Constable, DO

## 2019-02-06 NOTE — Assessment & Plan Note (Signed)
Cause of abdominal pain may be multifactorial.  Patient has not adhered to regimen for constipation, therefore this could be contributing.  Could also consider IBS as a cause.  With epigastric tenderness, could also consider GERD.  Doubt ruptured ovarian cyst given generalized pain.  Not currently a likely time for ovulation, less likely to cause generalized pain and tenderness palpation in epigastric region.  UA negative for UTI, pregnancy test negative.  Patient declined vaginal exam today, but also denies vaginal complaints.  Could consider PID without STD, caused by E. coli, but less likely.  Discussed with attending, Dr. Andria Frames, who agreed that this is likely acute on chronic pain.  Will hold off on imaging at this time as patient had not adhered to constipation regimen. -MiraLAX twice daily until bowel movement, then daily -High-fiber foods and increase water intake -Advised to return in 1 week for follow-up -Given ED precautions, including worsening pain, inability to tolerate p.o., vomiting with pain -We will check CBC today to rule out underlying infection, but is less likely given patient's lack of fever -If no improvement in 1 week follow-up, would consider transvaginal ultrasound

## 2019-02-06 NOTE — Progress Notes (Signed)
Subjective: Chief Complaint  Patient presents with  . Abdominal Pain     HPI: [name] is a 18 y.o. presenting to clinic today to discuss the following:  1 Abdominal Pain Having pain off and on for months, mostly with intercourse, mostly and left lower quadrant.  Notes that last night, during intercourse, she had pain in the right lower quadrant, then pain continued to worsen, now pain all over her entire abdomen.  Eating seems to make the pain worse.  Rates pain 7/10.  Worse with intercourse, worse with eating, worse with movement.  Worse than when she saw Dr. Mauri Reading in August, but notes she has not been taking MiraLAX as she was directed.  Last BM was about 1-2 days ago.  She stays very constipated and reports about 5-6 bowel movements in a month.  S/P appendectomy in July 2016.  LMP 9/16, stopped on 9/21.  Occasionally uses protection with intercourse, has had the same partner for over 2 years and has no concern for STDs.  No vaginal complaints.  Last time she was here she had a UTI and isn't sure if it is gone.  No fevers, chills, N/V.  UA today without UTI and pregnancy test negative.  Not on birth control.  Has not tried any medications today for the pain. Does not radiate to the back.  Has never had anything like this before.       ROS noted in HPI. Chief complaint noted.  Other Pertinent PMH: S/P appendectomy 11/2014, pathology report reviewed, noted to [facility] appendicitis Past Medical, Surgical, Social, and Family History Reviewed & Updated per EMR.      Social History   Tobacco Use  Smoking Status Never Smoker  Smokeless Tobacco Never Used   Smoking status noted.    Objective: BP 92/60   Pulse 87   Wt 115 lb 9.6 oz (52.4 kg)   LMP 01/24/2019   SpO2 99%   BMI 21.14 kg/m  Vitals and nursing notes reviewed  Physical Exam:  General: 18 y.o. female in NAD, able to stand up straight, walking without difficulty Cardio: RRR no m/r/g Lungs: CTAB, no wheezing,  no rhonchi, no crackles, no IWOB on RA Abdomen: Soft, diffusely tender to palpation, notes increased pain in epigastric region and bilateral lower quadrants, non-distended, positive bowel sounds, no CVA tenderness Skin: warm and dry Extremities: No edema   Results for orders placed or performed in visit on 02/06/19 (from the past 72 hour(s))  POCT urinalysis dipstick     Status: Abnormal   Collection Time: 02/06/19  2:00 PM  Result Value Ref Range   Color, UA yellow yellow   Clarity, UA clear clear   Glucose, UA negative negative mg/dL   Bilirubin, UA negative negative   Ketones, POC UA trace (5) (A) negative mg/dL   Spec Grav, UA >=6.226 (A) 1.010 - 1.025   Blood, UA negative negative   pH, UA 6.5 5.0 - 8.0   Protein Ur, POC negative negative mg/dL   Urobilinogen, UA 0.2 0.2 or 1.0 E.U./dL   Nitrite, UA Negative Negative   Leukocytes, UA Negative Negative  POCT urine pregnancy     Status: None   Collection Time: 02/06/19  2:00 PM  Result Value Ref Range   Preg Test, Ur Negative Negative    Assessment/Plan:  Generalized abdominal pain Cause of abdominal pain may be multifactorial.  Patient has not adhered to regimen for constipation, therefore this could be contributing.  Could also  consider IBS as a cause.  With epigastric tenderness, could also consider GERD.  Doubt ruptured ovarian cyst given generalized pain.  Not currently a likely time for ovulation, less likely to cause generalized pain and tenderness palpation in epigastric region.  UA negative for UTI, pregnancy test negative.  Patient declined vaginal exam today, but also denies vaginal complaints.  Could consider PID without STD, caused by E. coli, but less likely.  Discussed with attending, Dr. Andria Frames, who agreed that this is likely acute on chronic pain.  Will hold off on imaging at this time as patient had not adhered to constipation regimen. -MiraLAX twice daily until bowel movement, then daily -High-fiber foods and  increase water intake -Advised to return in 1 week for follow-up -Given ED precautions, including worsening pain, inability to tolerate p.o., vomiting with pain -We will check CBC today to rule out underlying infection, but is less likely given patient's lack of fever -If no improvement in 1 week follow-up, would consider transvaginal ultrasound      PATIENT EDUCATION PROVIDED: See AVS    Diagnosis and plan along with any newly prescribed medication(s) were discussed in detail with this patient today. The patient verbalized understanding and agreed with the plan. Patient advised if symptoms worsen return to clinic or ER.     Orders Placed This Encounter  Procedures  . CBC With Differential  . POCT urinalysis dipstick  . POCT urine pregnancy    No orders of the defined types were placed in this encounter.    [place] Constable, DO 02/06/2019, 5:10 PM PGY-2 [place]

## 2019-02-06 NOTE — Progress Notes (Signed)
o

## 2019-02-07 LAB — CBC WITH DIFFERENTIAL
Basophils Absolute: 0 10*3/uL (ref 0.0–0.2)
Basos: 1 %
EOS (ABSOLUTE): 0.1 10*3/uL (ref 0.0–0.4)
Eos: 1 %
Hematocrit: 36.3 % (ref 34.0–46.6)
Hemoglobin: 12.2 g/dL (ref 11.1–15.9)
Immature Grans (Abs): 0 10*3/uL (ref 0.0–0.1)
Immature Granulocytes: 0 %
Lymphocytes Absolute: 1.7 10*3/uL (ref 0.7–3.1)
Lymphs: 31 %
MCH: 29.5 pg (ref 26.6–33.0)
MCHC: 33.6 g/dL (ref 31.5–35.7)
MCV: 88 fL (ref 79–97)
Monocytes Absolute: 0.3 10*3/uL (ref 0.1–0.9)
Monocytes: 6 %
Neutrophils Absolute: 3.4 10*3/uL (ref 1.4–7.0)
Neutrophils: 61 %
RBC: 4.13 x10E6/uL (ref 3.77–5.28)
RDW: 13.1 % (ref 11.7–15.4)
WBC: 5.5 10*3/uL (ref 3.4–10.8)

## 2019-02-08 ENCOUNTER — Telehealth: Payer: Self-pay | Admitting: *Deleted

## 2019-02-08 NOTE — Telephone Encounter (Signed)
-----   Message from Cleophas Dunker, DO sent at 02/07/2019  9:41 AM EDT ----- Please inform patient her blood work was normal.

## 2019-02-08 NOTE — Telephone Encounter (Signed)
LVM to call office back to inform her of below. Morgaine Kimball Zimmerman Rumple, CMA  

## 2019-04-04 ENCOUNTER — Other Ambulatory Visit: Payer: Self-pay

## 2019-04-04 DIAGNOSIS — Z20822 Contact with and (suspected) exposure to covid-19: Secondary | ICD-10-CM

## 2019-04-06 LAB — NOVEL CORONAVIRUS, NAA: SARS-CoV-2, NAA: NOT DETECTED

## 2019-04-07 ENCOUNTER — Emergency Department (HOSPITAL_COMMUNITY)
Admission: EM | Admit: 2019-04-07 | Discharge: 2019-04-07 | Disposition: A | Discharge status: Home / Self Care | Payer: Medicaid Other | Attending: Emergency Medicine | Admitting: Emergency Medicine

## 2019-04-07 ENCOUNTER — Encounter (HOSPITAL_COMMUNITY): Payer: Self-pay

## 2019-04-07 ENCOUNTER — Other Ambulatory Visit: Payer: Self-pay

## 2019-04-07 DIAGNOSIS — Z79899 Other long term (current) drug therapy: Secondary | ICD-10-CM | POA: Insufficient documentation

## 2019-04-07 DIAGNOSIS — J0110 Acute frontal sinusitis, unspecified: Secondary | ICD-10-CM | POA: Insufficient documentation

## 2019-04-07 MED ORDER — DOXYCYCLINE HYCLATE 100 MG PO CAPS: 100.0000 mg | ORAL_CAPSULE | Freq: Two times a day (BID) | ORAL | 0 refills | Status: DC

## 2019-04-07 MED ORDER — PSEUDOEPHEDRINE HCL 60 MG PO TABS: 60.0000 mg | ORAL_TABLET | Freq: Four times a day (QID) | ORAL | 0 refills | Status: DC | PRN

## 2019-04-07 NOTE — ED Provider Notes (Signed)
[facility]-EMERGENCY DEPT Provider Note   CSN: [id number] Arrival date & time: 04/07/19  0910     History   Chief Complaint Chief Complaint  Patient presents with  . Nasal Congestion  . Sore Throat    HPI [name] is a 18 y.o. female.     Patient presents to the emergency department today with 1 week of URI symptoms.  Patient states that her symptoms started out as nasal congestion, sore throat, nausea and vomiting.  The nausea and vomiting improved and 2 days ago that she thought that her upper respiratory tract infection symptoms were improving.  However she has had worsening facial pain and sinus tenderness, sore throat over the past 48 hours.  She has taken Tylenol at home no chest pain or shortness of breath.  She was seen 2 days ago and had a coronavirus test which came back yesterday.  Patient works at the [employer] around [employer] and the public and does not know if she had any specific sick contacts.      Past Medical History:  Diagnosis Date  . Screen for STD (sexually transmitted disease) 02/28/2017  . Seasonal allergies   . Vaginal bleeding 09/17/2016  . Vulvovaginal candidiasis 11/15/2014    Patient Active Problem List   Diagnosis Date Noted  . Generalized abdominal pain 12/19/2018  . Contraception management 11/28/2017  . Screen for STD (sexually transmitted disease) 02/28/2017  . Vaginal candidiasis 11/15/2014    Past Surgical History:  Procedure Laterality Date  . LAPAROSCOPIC APPENDECTOMY N/A 11/08/2014   Procedure: APPENDECTOMY LAPAROSCOPIC;  Surgeon: Leonia Corona, MD;  Location: [facility] OR;  Service: Pediatrics;  Laterality: N/A;     OB History   No obstetric history on file.      Home Medications    Prior to Admission medications   Medication Sig Start Date End Date Taking? Authorizing Provider  cetirizine (ZYRTEC) 10 MG tablet Take 1 tablet (10 mg total) by mouth daily as needed for allergies. 12/21/18    Latrelle Dodrill, MD  doxycycline (VIBRAMYCIN) 100 MG capsule Take 1 capsule (100 mg total) by mouth 2 (two) times daily. 04/07/19   Renne Crigler, PA-C  fluconazole (DIFLUCAN) 150 MG tablet Take one tablet today. Take another tablet on 12/25/18 and the last tablet on 12/28/18. 12/21/18   Latrelle Dodrill, MD  ipratropium (ATROVENT) 0.06 % nasal spray Place 2 sprays into both nostrils 4 (four) times daily. 07/12/18   Cathie Hoops, Amy V, PA-C  miconazole (MONISTAT 7) 2 % vaginal cream Place 1 Applicatorful vaginally at bedtime. 12/21/18   Latrelle Dodrill, MD  norelgestromin-ethinyl estradiol (ORTHO EVRA) 150-35 MCG/24HR transdermal patch Place 1 patch onto the skin once a week. 12/18/18   Mullis, Kiersten P, DO  ondansetron (ZOFRAN) 4 MG tablet Take 1 tablet (4 mg total) by mouth every 8 (eight) hours as needed for nausea or vomiting. 01/25/18   Nestor Ramp, MD  pseudoephedrine (SUDAFED) 60 MG tablet Take 1 tablet (60 mg total) by mouth every 6 (six) hours as needed for congestion. 04/07/19   Renne Crigler, PA-C    Family History Family History  Problem Relation Age of Onset  . Hypertension Mother   . Asthma Sister   . Hypertension Maternal Grandmother   . Arthritis Maternal Grandmother   . Arthritis Maternal Grandfather     Social History Social History   Tobacco Use  . Smoking status: Never Smoker  . Smokeless tobacco: Never Used  Substance Use  Topics  . Alcohol use: No  . Drug use: No     Allergies   Penicillins, Amoxicillin, and Tramadol   Review of Systems Review of Systems  Constitutional: Positive for fever (resolved). Negative for chills and fatigue.  HENT: Positive for congestion, rhinorrhea, sinus pressure and sore throat. Negative for ear pain.   Eyes: Negative for redness.  Respiratory: Positive for cough (mild). Negative for shortness of breath and wheezing.   Gastrointestinal: Negative for abdominal pain, diarrhea (resolved), nausea and vomiting (resolved).   Genitourinary: Negative for dysuria.  Musculoskeletal: Negative for myalgias and neck stiffness.  Skin: Negative for rash.  Neurological: Negative for headaches.  Hematological: Negative for adenopathy.     Physical Exam Updated Vital Signs BP 110/74 (BP Location: Left Arm)   Pulse 94   Temp 99.1 F (37.3 C) (Oral)   Resp 18   LMP 02/23/2019   SpO2 100%   Physical Exam Vitals signs and nursing note reviewed.  Constitutional:      Appearance: She is well-developed.  HENT:     Head: Normocephalic and atraumatic.     Jaw: No trismus.     Right Ear: Tympanic membrane, ear canal and external ear normal.     Left Ear: Tympanic membrane, ear canal and external ear normal.     Nose: Congestion and rhinorrhea present. No mucosal edema.     Right Sinus: Frontal sinus tenderness present. No maxillary sinus tenderness.     Left Sinus: Frontal sinus tenderness present. No maxillary sinus tenderness.     Mouth/Throat:     Mouth: Mucous membranes are not dry. No oral lesions.     Pharynx: Uvula midline. No oropharyngeal exudate, posterior oropharyngeal erythema or uvula swelling.     Tonsils: No tonsillar abscesses.  Eyes:     General:        Right eye: No discharge.        Left eye: No discharge.     Conjunctiva/sclera: Conjunctivae normal.  Neck:     Musculoskeletal: Normal range of motion and neck supple.  Cardiovascular:     Rate and Rhythm: Normal rate and regular rhythm.     Heart sounds: Normal heart sounds.  Pulmonary:     Effort: Pulmonary effort is normal. No respiratory distress.     Breath sounds: Normal breath sounds. No wheezing or rales.  Abdominal:     Palpations: Abdomen is soft.     Tenderness: There is no abdominal tenderness.  Lymphadenopathy:     Cervical: No cervical adenopathy.  Skin:    General: Skin is warm and dry.  Neurological:     Mental Status: She is alert.      ED Treatments / Results  Labs (all labs ordered are listed, but only abnormal  results are displayed) Labs Reviewed - No data to display  EKG None  Radiology No results found.  Procedures Procedures (including critical care time)  Medications Ordered in ED Medications - No data to display   Initial Impression / Assessment and Plan / ED Course  I have reviewed the triage vital signs and the nursing notes.  Pertinent labs & imaging results that were available during my care of the patient were reviewed by me and considered in my medical decision making (see chart for details).        Patient seen and examined.  Reviewed coronavirus test performed 2 days ago which has returned as negative.  Patient reports greater than 7 days of sinusitis type symptoms  with initial improvement and now worsening.  I think it is reasonable to try a course of doxycycline (amoxicillin allergy) and decongestant.  Otherwise patient will need to continue to rest at home and use OTC meds for symptom control.  Work note written.  Patient urged to return with worsening symptoms, persistent vomiting, high fever, worsening trouble breathing or shortness of breath.  Vital signs reviewed and are as follows: BP 110/74 (BP Location: Left Arm)   Pulse 94   Temp 99.1 F (37.3 C) (Oral)   Resp 18   LMP 02/23/2019   SpO2 100%     Final Clinical Impressions(s) / ED Diagnoses   Final diagnoses:  Acute non-recurrent frontal sinusitis   Patient with multiple viral symptoms as above.  Will treat for sinusitis.  Recent coronavirus test negative.  I have low concern for coronavirus at this time.  Patient will rest at home and she is instructed not to return to work until she has improvement in her symptoms and is afebrile.   ED Discharge Orders         Ordered    doxycycline (VIBRAMYCIN) 100 MG capsule  2 times daily     04/07/19 1007    pseudoephedrine (SUDAFED) 60 MG tablet  Every 6 hours PRN     04/07/19 1007           Renne CriglerGeiple, Airyn Ellzey, PA-C 04/07/19 1017    Lorre NickAllen, Anthony, MD  04/07/19 1445

## 2019-04-07 NOTE — Discharge Instructions (Signed)
Please read and follow all provided instructions.  Your diagnoses today include:  1. Acute non-recurrent frontal sinusitis    Tests performed today include:  Vital signs. See below for your results today.   Coronavirus test performed a couple of days ago came back negative  Medications prescribed:   Doxycycline - antibiotic  You have been prescribed an antibiotic medicine: take the entire course of medicine even if you are feeling better. Stopping early can cause the antibiotic not to work.   Pseudoephedrine - decongestant medication to help with nasal congestion  Take any prescribed medications only as directed. Treatment for your infection is aimed at treating the symptoms. There are no medications, such as antibiotics, that will cure your infection.   Home care instructions:  Follow any educational materials contained in this packet.   Your illness is contagious and can be spread to others, especially during the first 3 or 4 days. It cannot be cured by antibiotics or other medicines. Take basic precautions such as washing your hands often, covering your mouth when you cough or sneeze, and avoiding public places where you could spread your illness to others.   Please continue drinking plenty of fluids.  Use over-the-counter medicines as needed as directed on packaging for symptom relief.  You may also use ibuprofen or tylenol as directed on packaging for pain or fever.  Do not take multiple medicines containing Tylenol or acetaminophen to avoid taking too much of this medication.  Follow-up instructions: Please follow-up with your primary care provider in the next 3 days for further evaluation of your symptoms if you are not feeling better.   Return instructions:   Please return to the Emergency Department if you experience worsening symptoms.   RETURN IMMEDIATELY IF you develop shortness of breath, confusion or altered mental status, a new rash, become dizzy, faint, or poorly  responsive, or are unable to be cared for at home.  Please return if you have persistent vomiting and cannot keep down fluids or develop a fever that is not controlled by tylenol or motrin.    Please return if you have any other emergent concerns.  Additional Information:  Your vital signs today were: BP 110/74 (BP Location: Left Arm)    Pulse 94    Temp 99.1 F (37.3 C) (Oral)    Resp 18    LMP 02/23/2019    SpO2 100%  If your blood pressure (BP) was elevated above 135/85 this visit, please have this repeated by your doctor within one month. --------------

## 2019-04-07 NOTE — ED Triage Notes (Addendum)
Pt awaiting COVID swab result on Wednesday. Pt states today she has nasal congestion, headache and sore throat. Pt states she has been managing fevers at home, last took Tylenol 650mg  at 2030. Pt denies CP, states she does become SHOB due to nasal congestion. Pt denies N/V/D. Pt states she has a productive cough.

## 2019-07-25 ENCOUNTER — Ambulatory Visit (HOSPITAL_COMMUNITY)
Admission: EM | Admit: 2019-07-25 | Discharge: 2019-07-25 | Disposition: A | Discharge status: Home / Self Care | Payer: Self-pay | Attending: Family Medicine | Admitting: Family Medicine

## 2019-07-25 ENCOUNTER — Encounter (HOSPITAL_COMMUNITY): Payer: Self-pay

## 2019-07-25 ENCOUNTER — Other Ambulatory Visit: Payer: Self-pay

## 2019-07-25 DIAGNOSIS — R0789 Other chest pain: Secondary | ICD-10-CM

## 2019-07-25 MED ORDER — CYCLOBENZAPRINE HCL 10 MG PO TABS: 5.0000 mg | ORAL_TABLET | Freq: Every day | ORAL | 0 refills | Status: DC

## 2019-07-25 MED ORDER — NAPROXEN 500 MG PO TABS: 500.0000 mg | ORAL_TABLET | Freq: Two times a day (BID) | ORAL | 0 refills | Status: DC

## 2019-07-25 NOTE — ED Triage Notes (Signed)
Pt c/o throbbing CP x 1-2 days.  Points to midepigastric area.  States pain occurs with movement and with palpation.  States she had an episode of difficulty breathing and light-headedness this morning.  States her head "feels crazy".  No apparent distress during intake.

## 2019-07-25 NOTE — Discharge Instructions (Addendum)
Your EKG was normal Lungs clear I do not believe this is heart or lung related.  I believe you have strained a muscle  Take the naproxen twice a day and flexeril at bedtime . Heat and gentle stretching to the area.  Follow up as needed for continued or worsening symptoms

## 2019-07-26 NOTE — ED Provider Notes (Signed)
[facility]    CSN: [id number] Arrival date & time: 07/25/19  1416      History   Chief Complaint Chief Complaint  Patient presents with  . Chest Pain    HPI [name] is a 19 y.o. female.   Pt is a 19 year old female that presents with central chest pain. This has been constant, waxing and waning. Worse with movement, breathing and palpation. Describes as sharp and aching at times. Does some heavy lifting at work. No associated N,V,SOB, diaphoresis. She has not taken anything for the symptoms.   ROS per HPI      Past Medical History:  Diagnosis Date  . Screen for STD (sexually transmitted disease) 02/28/2017  . Seasonal allergies   . Vaginal bleeding 09/17/2016  . Vulvovaginal candidiasis 11/15/2014    Patient Active Problem List   Diagnosis Date Noted  . Generalized abdominal pain 12/19/2018  . Contraception management 11/28/2017  . Screen for STD (sexually transmitted disease) 02/28/2017  . Vaginal candidiasis 11/15/2014    Past Surgical History:  Procedure Laterality Date  . LAPAROSCOPIC APPENDECTOMY N/A 11/08/2014   Procedure: APPENDECTOMY LAPAROSCOPIC;  Surgeon: Leonia Corona, MD;  Location: [facility] OR;  Service: Pediatrics;  Laterality: N/A;    OB History   No obstetric history on file.      Home Medications    Prior to Admission medications   Medication Sig Start Date End Date Taking? Authorizing Provider  cetirizine (ZYRTEC) 10 MG tablet Take 1 tablet (10 mg total) by mouth daily as needed for allergies. 12/21/18  Yes Latrelle Dodrill, MD  ipratropium (ATROVENT) 0.06 % nasal spray Place 2 sprays into both nostrils 4 (four) times daily. 07/12/18  Yes Yu, Amy V, PA-C  cyclobenzaprine (FLEXERIL) 10 MG tablet Take 0.5 tablets (5 mg total) by mouth at bedtime. 07/25/19   Dahlia Byes A, NP  naproxen (NAPROSYN) 500 MG tablet Take 1 tablet (500 mg total) by mouth 2 (two) times daily. 07/25/19   Dahlia Byes A, NP  norelgestromin-ethinyl estradiol  (ORTHO EVRA) 150-35 MCG/24HR transdermal patch Place 1 patch onto the skin once a week. 12/18/18 07/25/19  Joana Reamer, DO    Family History Family History  Problem Relation Age of Onset  . Hypertension Mother   . Asthma Sister   . Hypertension Maternal Grandmother   . Arthritis Maternal Grandmother   . Arthritis Maternal Grandfather     Social History Social History   Tobacco Use  . Smoking status: Never Smoker  . Smokeless tobacco: Never Used  Substance Use Topics  . Alcohol use: No  . Drug use: No     Allergies   Penicillins, Amoxicillin, and Tramadol   Review of Systems Review of Systems   Physical Exam Triage Vital Signs ED Triage Vitals  Enc Vitals Group     BP 07/25/19 1433 109/73     Pulse Rate 07/25/19 1433 77     Resp 07/25/19 1433 18     Temp 07/25/19 1433 98.4 F (36.9 C)     Temp Source 07/25/19 1433 Oral     SpO2 07/25/19 1433 100 %     Weight --      Height --      Head Circumference --      Peak Flow --      Pain Score 07/25/19 1430 4     Pain Loc --      Pain Edu? --      Excl. in GC? --  No data found.  Updated Vital Signs BP 109/73 (BP Location: Left Arm)   Pulse 77   Temp 98.4 F (36.9 C) (Oral)   Resp 18   LMP 07/19/2019 (Exact Date)   SpO2 100%   Visual Acuity Right Eye Distance:   Left Eye Distance:   Bilateral Distance:    Right Eye Near:   Left Eye Near:    Bilateral Near:     Physical Exam Vitals and nursing note reviewed.  Constitutional:      General: She is not in acute distress.    Appearance: Normal appearance. She is not ill-appearing, toxic-appearing or diaphoretic.  HENT:     Head: Normocephalic and atraumatic.     Nose: Nose normal.  Eyes:     Conjunctiva/sclera: Conjunctivae normal.  Cardiovascular:     Rate and Rhythm: Normal rate and regular rhythm.  Pulmonary:     Effort: Pulmonary effort is normal.     Breath sounds: Normal breath sounds.  Musculoskeletal:        General: Normal  range of motion.     Cervical back: Normal range of motion.     Comments: TTP mid sternal   Skin:    General: Skin is warm and dry.  Neurological:     General: No focal deficit present.     Mental Status: She is alert.  Psychiatric:        Mood and Affect: Mood normal.       UC Treatments / Results  Labs (all labs ordered are listed, but only abnormal results are displayed) Labs Reviewed - No data to display  EKG   Radiology No results found.  Procedures Procedures (including critical care time)  Medications Ordered in UC Medications - No data to display  Initial Impression / Assessment and Plan / UC Course  I have reviewed the triage vital signs and the nursing notes.  Pertinent labs & imaging results that were available during my care of the patient were reviewed by me and considered in my medical decision making (see chart for details).     Chest wall pain- treating with naproxen and low dose muscle relaxant.  Heat and gentle stretching.  EKG normal sinus rhythm and normal rate.  Lungs clear.   Follow up as needed for continued or worsening symptoms    Final Clinical Impressions(s) / UC Diagnoses   Final diagnoses:  Chest wall pain     Discharge Instructions     Your EKG was normal Lungs clear I do not believe this is heart or lung related.  I believe you have strained a muscle  Take the naproxen twice a day and flexeril at bedtime . Heat and gentle stretching to the area.  Follow up as needed for continued or worsening symptoms     ED Prescriptions    Medication Sig Dispense Auth. Provider   naproxen (NAPROSYN) 500 MG tablet Take 1 tablet (500 mg total) by mouth 2 (two) times daily. 30 tablet Taequan Stockhausen A, NP   cyclobenzaprine (FLEXERIL) 10 MG tablet Take 0.5 tablets (5 mg total) by mouth at bedtime. 20 tablet Loura Halt A, NP     PDMP not reviewed this encounter.   Orvan July, NP 07/26/19 [phone]

## 2019-07-29 ENCOUNTER — Ambulatory Visit (HOSPITAL_COMMUNITY)
Admission: EM | Admit: 2019-07-29 | Discharge: 2019-07-29 | Disposition: A | Discharge status: Home / Self Care | Payer: Medicaid Other | Attending: Emergency Medicine | Admitting: Emergency Medicine

## 2019-07-29 ENCOUNTER — Other Ambulatory Visit: Payer: Self-pay

## 2019-07-29 ENCOUNTER — Encounter (HOSPITAL_COMMUNITY): Payer: Self-pay | Admitting: Emergency Medicine

## 2019-07-29 DIAGNOSIS — N898 Other specified noninflammatory disorders of vagina: Secondary | ICD-10-CM

## 2019-07-29 DIAGNOSIS — M546 Pain in thoracic spine: Secondary | ICD-10-CM | POA: Insufficient documentation

## 2019-07-29 DIAGNOSIS — Z3202 Encounter for pregnancy test, result negative: Secondary | ICD-10-CM

## 2019-07-29 NOTE — Discharge Instructions (Signed)
Please pick up Naprosyn from prior visit and take twice daily for back pain Please supplement with Flexeril as needed at home or bedtime, may cause drowsiness, do not drive or work after taking  Vaginal swab pending to evaluate for discharge, we will call with these results and provide any further medicines as needed based off results

## 2019-07-29 NOTE — ED Triage Notes (Signed)
Pt c/o back pain related to recent visit. She states she was unable to get muscle relaxer from the pharmacy. Pain is now radiating to her back.   Pt also c/o vaginal discharge with some bleeding. She states she did have an abortion late January.  And also had menstrual period recently. Discharge started 2-3 days ago.

## 2019-07-29 NOTE — ED Provider Notes (Signed)
[facility]    CSN: [id number] Arrival date & time: 07/29/19  1458      History   Chief Complaint Chief Complaint  Patient presents with  . Back Pain  . Vaginal Discharge    HPI [name] is a 19 y.o. female no contributing past medical history presenting today for evaluation of back pain and vaginal discharge.  Patient notes that recently she has had chest discomfort which she was seen here recently for.  His chest discomfort has improved, but has developed more diffuse back pain.  Has pain with most movements.  Denies any injury fall, trauma, increase activity or heavy lifting.  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  She denies associated shortness of breath.  Did not pick up medicines of naproxen and Flexeril from prior visit.  Patient is also concerned about vaginal discharge.  She has noticed this over the past 2 to 3 days.  Occasionally brown, occasionally pink.  Has associated odor with this.  Denies any itching or irritation.  Feels different than typical yeast infections.  Has had history of BV, but she is unsure if this is similar.  She recently had abortion in January/February and reports taking oral pills for this.  She has had some bleeding, but had menstrual cycle recently on 3/11.  She is not on any form of birth control.  Has had pregnancy test since which was negative.  Denies abdominal pain.  Denies nausea or vomiting.  Has returned to being sexually active since abortion.  HPI  Past Medical History:  Diagnosis Date  . Screen for STD (sexually transmitted disease) 02/28/2017  . Seasonal allergies   . Vaginal bleeding 09/17/2016  . Vulvovaginal candidiasis 11/15/2014    Patient Active Problem List   Diagnosis Date Noted  . Generalized abdominal pain 12/19/2018  . Contraception management 11/28/2017  . Screen for STD (sexually transmitted disease) 02/28/2017  . Vaginal  candidiasis 11/15/2014    Past Surgical History:  Procedure Laterality Date  . LAPAROSCOPIC APPENDECTOMY N/A 11/08/2014   Procedure: APPENDECTOMY LAPAROSCOPIC;  Surgeon: Leonia Corona, MD;  Location: [facility];  Service: Pediatrics;  Laterality: N/A;    OB History   No obstetric history on file.      Home Medications    Prior to Admission medications   Medication Sig Start Date End Date Taking? Authorizing Provider  cetirizine (ZYRTEC) 10 MG tablet Take 1 tablet (10 mg total) by mouth daily as needed for allergies. 12/21/18  Yes Latrelle Dodrill, MD  ipratropium (ATROVENT) 0.06 % nasal spray Place 2 sprays into both nostrils 4 (four) times daily. 07/12/18  Yes Yu, Amy V, PA-C  cyclobenzaprine (FLEXERIL) 10 MG tablet Take 0.5 tablets (5 mg total) by mouth at bedtime. 07/25/19   Dahlia Byes A, NP  naproxen (NAPROSYN) 500 MG tablet Take 1 tablet (500 mg total) by mouth 2 (two) times daily. 07/25/19   Dahlia Byes A, NP  norelgestromin-ethinyl estradiol (ORTHO EVRA) 150-35 MCG/24HR transdermal patch Place 1 patch onto the skin once a week. 12/18/18 07/25/19  Joana Reamer, DO    Family History Family History  Problem Relation Age of Onset  . Hypertension Mother   . Asthma Sister   . Hypertension Maternal Grandmother   . Arthritis Maternal Grandmother   . Arthritis Maternal Grandfather     Social History Social History   Tobacco Use  . Smoking status: Never Smoker  .  Smokeless tobacco: Never Used  Substance Use Topics  . Alcohol use: No  . Drug use: No     Allergies   Penicillins, Amoxicillin, and Tramadol   Review of Systems Review of Systems  Constitutional: Negative for fever.  Respiratory: Negative for shortness of breath.   Cardiovascular: Positive for chest pain.  Gastrointestinal: Negative for abdominal pain, diarrhea, nausea and vomiting.  Genitourinary: Positive for vaginal discharge. Negative for dysuria, flank pain, genital sores, hematuria, menstrual  problem, vaginal bleeding and vaginal pain.  Musculoskeletal: Positive for back pain and myalgias.  Skin: Negative for rash.  Neurological: Negative for dizziness, light-headedness and headaches.     Physical Exam Triage Vital Signs ED Triage Vitals  Enc Vitals Group     BP 07/29/19 1532 105/63     Pulse Rate 07/29/19 1532 78     Resp 07/29/19 1532 14     Temp 07/29/19 1532 98.5 F (36.9 C)     Temp Source 07/29/19 1532 Oral     SpO2 --      Weight --      Height --      Head Circumference --      Peak Flow --      Pain Score 07/29/19 1534 7     Pain Loc --      Pain Edu? --      Excl. in GC? --    No data found.  Updated Vital Signs BP 105/63 (BP Location: Left Arm)   Pulse 78   Temp 98.5 F (36.9 C) (Oral)   Resp 14   LMP 07/19/2019 (Exact Date)   Visual Acuity Right Eye Distance:   Left Eye Distance:   Bilateral Distance:    Right Eye Near:   Left Eye Near:    Bilateral Near:     Physical Exam Vitals and nursing note reviewed.  Constitutional:      Appearance: She is well-developed.     Comments: No acute distress  HENT:     Head: Normocephalic and atraumatic.     Nose: Nose normal.  Eyes:     Conjunctiva/sclera: Conjunctivae normal.  Cardiovascular:     Rate and Rhythm: Normal rate.  Pulmonary:     Effort: Pulmonary effort is normal. No respiratory distress.     Comments: Breathing comfortably at rest, CTABL, no wheezing, rales or other adventitious sounds auscultated  Anterior chest tender to palpation to right lower sternal border; also tender throughout left anterior chest Abdominal:     General: There is no distension.  Genitourinary:    Comments: Normal external female genitalia, no rashes or lesions noted, vaginal mucosa pink, cervix pink without erythema, whitish discharge noted within vaginal vault, no bleeding noted Musculoskeletal:        General: Normal range of motion.     Cervical back: Neck supple.     Comments: Back: Diffusely  tender throughout cervical spine and thoracic spine, tender increasing with palpation of bilateral thoracic and cervical musculature  Full active range of motion of neck, full active range of motion of shoulders, strength at shoulders 5/5 and equal bilaterally, grip strength 5/5 bilaterally, radial pulse 2+ bilaterally  Skin:    General: Skin is warm and dry.  Neurological:     Mental Status: She is alert and oriented to person, place, and time.      UC Treatments / Results  Labs (all labs ordered are listed, but only abnormal results are displayed) Labs Reviewed  POC URINE PREG,  ED  POCT PREGNANCY, URINE  CERVICOVAGINAL ANCILLARY ONLY    EKG   Radiology No results found.  Procedures Procedures (including critical care time)  Medications Ordered in UC Medications - No data to display  Initial Impression / Assessment and Plan / UC Course  I have reviewed the triage vital signs and the nursing notes.  Pertinent labs & imaging results that were available during my care of the patient were reviewed by me and considered in my medical decision making (see chart for details).    Suspect back pain most likely muscular straining, no mechanism of injury, more tender more laterally.  Recommending anti-inflammatories and muscle relaxers, please pick up prescriptions from prior visit.  Offered shot of Toradol, patient declined.  Vaginal swab pending for discharge.  Deferring empiric treatment till results return.  Pregnancy test negative  Discussed strict return precautions. Patient verbalized understanding and is agreeable with plan.  Final Clinical Impressions(s) / UC Diagnoses   Final diagnoses:  Acute bilateral thoracic back pain  Vaginal discharge     Discharge Instructions     Please pick up Naprosyn from prior visit and take twice daily for back pain Please supplement with Flexeril as needed at home or bedtime, may cause drowsiness, do not drive or work after taking   Vaginal swab pending to evaluate for discharge, we will call with these results and provide any further medicines as needed based off results    ED Prescriptions    None     PDMP not reviewed this encounter.   Janith Lima, [place] 07/29/19 1623

## 2019-07-31 ENCOUNTER — Telehealth (HOSPITAL_COMMUNITY): Payer: Self-pay

## 2019-07-31 LAB — CERVICOVAGINAL ANCILLARY ONLY
Bacterial vaginitis: POSITIVE — AB
Candida vaginitis: NEGATIVE
Chlamydia: NEGATIVE
Neisseria Gonorrhea: NEGATIVE
Trichomonas: NEGATIVE

## 2019-07-31 MED ORDER — METRONIDAZOLE 500 MG PO TABS: 500.0000 mg | ORAL_TABLET | Freq: Two times a day (BID) | ORAL | 0 refills | Status: DC

## 2019-08-01 MED ORDER — METRONIDAZOLE 0.75 % VA GEL: 1.0000 | Freq: Every day | VAGINAL | 0 refills | Status: AC

## 2019-08-01 NOTE — Telephone Encounter (Signed)
Cannot tolerate PO flagyl, requests gel. Patterson Hammersmith, PA gives verbal order for flagyl gel.

## 2019-08-15 ENCOUNTER — Other Ambulatory Visit: Payer: Self-pay

## 2019-08-15 ENCOUNTER — Emergency Department (HOSPITAL_COMMUNITY)
Admission: EM | Admit: 2019-08-15 | Discharge: 2019-08-16 | Disposition: A | Discharge status: Home / Self Care | Payer: Medicaid Other | Attending: Emergency Medicine | Admitting: Emergency Medicine

## 2019-08-15 ENCOUNTER — Encounter (HOSPITAL_COMMUNITY): Payer: Self-pay | Admitting: *Deleted

## 2019-08-15 DIAGNOSIS — T161XXA Foreign body in right ear, initial encounter: Secondary | ICD-10-CM | POA: Insufficient documentation

## 2019-08-15 DIAGNOSIS — Y93E8 Activity, other personal hygiene: Secondary | ICD-10-CM | POA: Insufficient documentation

## 2019-08-15 DIAGNOSIS — X58XXXA Exposure to other specified factors, initial encounter: Secondary | ICD-10-CM | POA: Insufficient documentation

## 2019-08-15 DIAGNOSIS — Y929 Unspecified place or not applicable: Secondary | ICD-10-CM | POA: Insufficient documentation

## 2019-08-15 DIAGNOSIS — Y999 Unspecified external cause status: Secondary | ICD-10-CM | POA: Insufficient documentation

## 2019-08-15 NOTE — ED Triage Notes (Signed)
The pt had a q-tip in her rt ear and the cotton came off in her rt ear and she could not get it out  No pain just uncomfortable  She done think that she will wait very long  lmp now

## 2019-08-16 NOTE — ED Provider Notes (Signed)
[facility] EMERGENCY DEPARTMENT Provider Note   CSN: [id number] Arrival date & time: 08/15/19  2208     History Chief Complaint  Patient presents with  . Foreign Body    [name] is a 19 y.o. female.  Patient to ED with c/o cotton from Q-tip stuck in right ear since yesterday. No bleeding or significant pain.  The history is provided by the patient. No language interpreter was used.       Past Medical History:  Diagnosis Date  . Screen for STD (sexually transmitted disease) 02/28/2017  . Seasonal allergies   . Vaginal bleeding 09/17/2016  . Vulvovaginal candidiasis 11/15/2014    Patient Active Problem List   Diagnosis Date Noted  . Generalized abdominal pain 12/19/2018  . Contraception management 11/28/2017  . Screen for STD (sexually transmitted disease) 02/28/2017  . Vaginal candidiasis 11/15/2014    Past Surgical History:  Procedure Laterality Date  . LAPAROSCOPIC APPENDECTOMY N/A 11/08/2014   Procedure: APPENDECTOMY LAPAROSCOPIC;  Surgeon: Gerald Stabs, MD;  Location: [facility];  Service: Pediatrics;  Laterality: N/A;     OB History   No obstetric history on file.     Family History  Problem Relation Age of Onset  . Hypertension Mother   . Asthma Sister   . Hypertension Maternal Grandmother   . Arthritis Maternal Grandmother   . Arthritis Maternal Grandfather     Social History   Tobacco Use  . Smoking status: Never Smoker  . Smokeless tobacco: Never Used  Substance Use Topics  . Alcohol use: No  . Drug use: No    Home Medications Prior to Admission medications   Medication Sig Start Date End Date Taking? Authorizing Provider  cetirizine (ZYRTEC) 10 MG tablet Take 1 tablet (10 mg total) by mouth daily as needed for allergies. 12/21/18   Leeanne Rio, MD  cyclobenzaprine (FLEXERIL) 10 MG tablet Take 0.5 tablets (5 mg total) by mouth at bedtime. 07/25/19   Bast, Tressia Miners A, NP  ipratropium (ATROVENT) 0.06 % nasal spray  Place 2 sprays into both nostrils 4 (four) times daily. 07/12/18   Tasia Catchings, Amy V, PA-C  naproxen (NAPROSYN) 500 MG tablet Take 1 tablet (500 mg total) by mouth 2 (two) times daily. 07/25/19   Loura Halt A, NP  norelgestromin-ethinyl estradiol (ORTHO EVRA) 150-35 MCG/24HR transdermal patch Place 1 patch onto the skin once a week. 12/18/18 07/25/19  Mullis, Kiersten P, DO    Allergies    Penicillins, Amoxicillin, and Tramadol  Review of Systems   Review of Systems  HENT:       See HPI.    Physical Exam Updated Vital Signs BP (!) 105/54 (BP Location: Left Arm)   Pulse 79   Temp 98.4 F (36.9 C) (Oral)   Resp 16   Ht 5\' 1"  (1.549 m)   Wt 50.8 kg   LMP 08/15/2019   SpO2 98%   BMI 21.16 kg/m   Physical Exam Vitals and nursing note reviewed.  HENT:     Ears:     Comments: Cotton piece visible in distal ear canal near meatus. Easily removed with forceps. No blood.    ED Results / Procedures / Treatments   Labs (all labs ordered are listed, but only abnormal results are displayed) Labs Reviewed - No data to display  EKG None  Radiology No results found.  Procedures Procedures (including critical care time)  Medications Ordered in ED Medications - No data to display  ED Course  I have reviewed the triage vital signs and the nursing notes.  Pertinent labs & imaging results that were available during my care of the patient were reviewed by me and considered in my medical decision making (see chart for details).    MDM Rules/Calculators/A&P                      Patient to ED with FB in right ear, easily removed with forceps. No bleeding.  Final Clinical Impression(s) / ED Diagnoses Final diagnoses:  None   1. FB right ear  Rx / DC Orders ED Discharge Orders    None       [name] 08/16/19 0618    Nira Conn, MD 08/16/19 [phone]

## 2019-08-16 NOTE — ED Notes (Signed)
Patient called x1 for room with no answer and not visible in the lobby

## 2019-08-16 NOTE — ED Notes (Signed)
Patient verbalizes understanding of discharge instructions. Opportunity for questioning and answers were provided. All questions answered completely. Armband removed by staff, pt discharged from ED. Ambulatory from ED with strong, steady gait 

## 2019-08-16 NOTE — ED Notes (Deleted)
Patient called for room with no answer, unable to locate patient in ED lobby.

## 2019-10-19 ENCOUNTER — Encounter (HOSPITAL_COMMUNITY): Payer: Self-pay

## 2019-10-19 ENCOUNTER — Ambulatory Visit (HOSPITAL_COMMUNITY)
Admission: EM | Admit: 2019-10-19 | Discharge: 2019-10-19 | Disposition: A | Discharge status: Home / Self Care | Payer: Medicaid Other

## 2019-10-19 ENCOUNTER — Other Ambulatory Visit: Payer: Self-pay

## 2019-10-19 DIAGNOSIS — Z3202 Encounter for pregnancy test, result negative: Secondary | ICD-10-CM

## 2019-10-19 DIAGNOSIS — N760 Acute vaginitis: Secondary | ICD-10-CM | POA: Insufficient documentation

## 2019-10-19 LAB — POC URINE PREG, ED: Preg Test, Ur: NEGATIVE

## 2019-10-19 MED ORDER — CLOTRIMAZOLE 1 % VA CREA: 1.0000 | TOPICAL_CREAM | Freq: Every day | VAGINAL | 0 refills | Status: DC

## 2019-10-19 MED ORDER — FLUCONAZOLE 150 MG PO TABS: 150.0000 mg | ORAL_TABLET | Freq: Once | ORAL | 0 refills | Status: AC

## 2019-10-19 NOTE — Discharge Instructions (Signed)
Take 1 tab of diflucan today, may repeat in 72 hours if still having symptoms Trial of clotrimazole vaginal cream twice weekly for prevention, probiotics- eating daily yogurt/activia  We are testing you for Gonorrhea, Chlamydia, Trichomonas, Yeast and Bacterial Vaginosis. We will call you if anything is positive and let you know if you require any further treatment. Please inform partners of any positive results.   Please return if symptoms not improving with treatment, development of fever, nausea, vomiting, abdominal pain.

## 2019-10-19 NOTE — ED Triage Notes (Signed)
Pt c/o vaginal irritation, itching and white discharge for approx 2 days. Pt states she has had recurrent vaginal itching after having intercourse with her partner. Pt states irritation when her partner uses condoms and on an occasion when no condom was used. Reports that her partner saw a doctor today and was told that the boyfriend did not have any STIs or yeast.  Denies abdominal pain, n/v/d, fever, chills.

## 2019-10-20 NOTE — ED Provider Notes (Signed)
[facility]    CSN: [id number] Arrival date & time: 10/19/19  1556      History   Chief Complaint Chief Complaint  Patient presents with   Vaginitis    HPI [name] is a 19 y.o. female presenting today for evaluation of vaginal irritation and discharge.  Patient reports that over the past couple of days she has developed white discharge with associated irritation and itching.  She reports recurrent issues with yeast infections especially after intercourse with her partner.  She states that this happens multiple times a month.  Last menstrual cycle ended a few days ago.  She is not on birth control.  She also reports history of BV, but symptoms today feel more like yeast.  She has tried to avoid triggers, but is concerned about the recurrent nature of this.  HPI  Past Medical History:  Diagnosis Date   Screen for STD (sexually transmitted disease) 02/28/2017   Seasonal allergies    Vaginal bleeding 09/17/2016   Vulvovaginal candidiasis 11/15/2014    Patient Active Problem List   Diagnosis Date Noted   Generalized abdominal pain 12/19/2018   Contraception management 11/28/2017   Screen for STD (sexually transmitted disease) 02/28/2017   Vaginal candidiasis 11/15/2014    Past Surgical History:  Procedure Laterality Date   LAPAROSCOPIC APPENDECTOMY N/A 11/08/2014   Procedure: APPENDECTOMY LAPAROSCOPIC;  Surgeon: Leonia Corona, MD;  Location: [facility];  Service: Pediatrics;  Laterality: N/A;    OB History   No obstetric history on file.      Home Medications    Prior to Admission medications   Medication Sig Start Date End Date Taking? Authorizing Provider  cetirizine (ZYRTEC) 10 MG tablet Take 1 tablet (10 mg total) by mouth daily as needed for allergies. 12/21/18   Latrelle Dodrill, MD  clotrimazole (GYNE-LOTRIMIN) 1 % vaginal cream Place 1 Applicatorful vaginally at bedtime. Twice weekly 10/19/19   Leeman Johnsey C, PA-C  cyclobenzaprine  (FLEXERIL) 10 MG tablet Take 0.5 tablets (5 mg total) by mouth at bedtime. 07/25/19   Bast, Gloris Manchester A, NP  ipratropium (ATROVENT) 0.06 % nasal spray Place 2 sprays into both nostrils 4 (four) times daily. 07/12/18   Cathie Hoops, Amy V, PA-C  naproxen (NAPROSYN) 500 MG tablet Take 1 tablet (500 mg total) by mouth 2 (two) times daily. 07/25/19   Dahlia Byes A, NP  norelgestromin-ethinyl estradiol (ORTHO EVRA) 150-35 MCG/24HR transdermal patch Place 1 patch onto the skin once a week. 12/18/18 07/25/19  Joana Reamer, DO    Family History Family History  Problem Relation Age of Onset   Hypertension Mother    Asthma Sister    Hypertension Maternal Grandmother    Arthritis Maternal Grandmother    Arthritis Maternal Grandfather     Social History Social History   Tobacco Use   Smoking status: Never Smoker   Smokeless tobacco: Never Used  Building services engineer Use: Never used  Substance Use Topics   Alcohol use: No   Drug use: No     Allergies   Penicillins, Amoxicillin, and Tramadol   Review of Systems Review of Systems  Constitutional: Negative for fever.  Respiratory: Negative for shortness of breath.   Cardiovascular: Negative for chest pain.  Gastrointestinal: Negative for abdominal pain, diarrhea, nausea and vomiting.  Genitourinary: Positive for vaginal discharge. Negative for dysuria, flank pain, genital sores, hematuria, menstrual problem, vaginal bleeding and vaginal pain.  Musculoskeletal: Negative for back pain.  Skin: Negative for rash.  Neurological: Negative for dizziness, light-headedness and headaches.     Physical Exam Triage Vital Signs ED Triage Vitals  Enc Vitals Group     BP 10/19/19 1718 120/80     Pulse Rate 10/19/19 1718 74     Resp 10/19/19 1718 16     Temp 10/19/19 1718 98.1 F (36.7 C)     Temp Source 10/19/19 1718 Oral     SpO2 10/19/19 1718 100 %     Weight --      Height --      Head Circumference --      Peak Flow --      Pain Score  10/19/19 1716 4     Pain Loc --      Pain Edu? --      Excl. in GC? --    No data found.  Updated Vital Signs BP 120/80 (BP Location: Left Arm)    Pulse 74    Temp 98.1 F (36.7 C) (Oral)    Resp 16    LMP 10/08/2019    SpO2 100%   Visual Acuity Right Eye Distance:   Left Eye Distance:   Bilateral Distance:    Right Eye Near:   Left Eye Near:    Bilateral Near:     Physical Exam Vitals and nursing note reviewed.  Constitutional:      Appearance: She is well-developed.     Comments: No acute distress  HENT:     Head: Normocephalic and atraumatic.     Nose: Nose normal.  Eyes:     Conjunctiva/sclera: Conjunctivae normal.  Cardiovascular:     Rate and Rhythm: Normal rate.  Pulmonary:     Effort: Pulmonary effort is normal. No respiratory distress.  Abdominal:     General: There is no distension.  Musculoskeletal:        General: Normal range of motion.     Cervical back: Neck supple.  Skin:    General: Skin is warm and dry.  Neurological:     Mental Status: She is alert and oriented to person, place, and time.      UC Treatments / Results  Labs (all labs ordered are listed, but only abnormal results are displayed) Labs Reviewed  POC URINE PREG, ED  CERVICOVAGINAL ANCILLARY ONLY    EKG   Radiology No results found.  Procedures Procedures (including critical care time)  Medications Ordered in UC Medications - No data to display  Initial Impression / Assessment and Plan / UC Course  I have reviewed the triage vital signs and the nursing notes.  Pertinent labs & imaging results that were available during my care of the patient were reviewed by me and considered in my medical decision making (see chart for details).    Pregnancy test negative Vaginal swab pending today, empirically treating for yeast provided 2 tabs of Diflucan, discussed with patient trial of vaginal clotrimazole twice weekly and probiotics for prevention.  Also recommended following  up with OB/GYN for persistent/recurrent yeast/vaginitis. Discussed strict return precautions. Patient verbalized understanding and is agreeable with plan.   Final Clinical Impressions(s) / UC Diagnoses   Final diagnoses:  Vaginitis and vulvovaginitis     Discharge Instructions     Take 1 tab of diflucan today, may repeat in 72 hours if still having symptoms Trial of clotrimazole vaginal cream twice weekly for prevention, probiotics- eating daily yogurt/activia  We are testing you for Gonorrhea, Chlamydia, Trichomonas, Yeast and Bacterial Vaginosis. We will call you  if anything is positive and let you know if you require any further treatment. Please inform partners of any positive results.   Please return if symptoms not improving with treatment, development of fever, nausea, vomiting, abdominal pain.    ED Prescriptions    Medication Sig Dispense Auth. Provider   fluconazole (DIFLUCAN) 150 MG tablet Take 1 tablet (150 mg total) by mouth once for 1 dose. 2 tablet Shallon Yaklin C, PA-C   clotrimazole (GYNE-LOTRIMIN) 1 % vaginal cream Place 1 Applicatorful vaginally at bedtime. Twice weekly 45 g Andruw Battie, Felida C, PA-C     PDMP not reviewed this encounter.   Janith Lima, [place] 10/20/19 [phone]

## 2019-10-22 LAB — CERVICOVAGINAL ANCILLARY ONLY
Bacterial Vaginitis (gardnerella): NEGATIVE
Candida Glabrata: NEGATIVE
Candida Vaginitis: POSITIVE — AB
Chlamydia: NEGATIVE
Comment: NEGATIVE
Comment: NEGATIVE
Comment: NEGATIVE
Comment: NEGATIVE
Comment: NEGATIVE
Comment: NORMAL
Neisseria Gonorrhea: NEGATIVE
Trichomonas: NEGATIVE

## 2019-11-30 ENCOUNTER — Ambulatory Visit (HOSPITAL_COMMUNITY)
Admission: EM | Admit: 2019-11-30 | Discharge: 2019-11-30 | Disposition: A | Discharge status: Home / Self Care | Payer: Self-pay | Attending: Family Medicine | Admitting: Family Medicine

## 2019-11-30 ENCOUNTER — Encounter (HOSPITAL_COMMUNITY): Payer: Self-pay

## 2019-11-30 ENCOUNTER — Other Ambulatory Visit: Payer: Self-pay

## 2019-11-30 DIAGNOSIS — J029 Acute pharyngitis, unspecified: Secondary | ICD-10-CM

## 2019-11-30 LAB — POCT RAPID STREP A: Streptococcus, Group A Screen (Direct): NEGATIVE

## 2019-11-30 NOTE — ED Provider Notes (Signed)
[facility]    CSN: [id number] Arrival date & time: 11/30/19  0841      History   Chief Complaint Chief Complaint  Patient presents with  . Sore Throat    HPI [name] is a 19 y.o. female.   HPI  Sore throat since yesterday.  Feels warm but did not check her temperature.  No runny or stuffy nose, chest congestion, ear pain, headaches or body aches.  No known exposure to strep.  Past Medical History:  Diagnosis Date  . Screen for STD (sexually transmitted disease) 02/28/2017  . Seasonal allergies   . Vaginal bleeding 09/17/2016  . Vulvovaginal candidiasis 11/15/2014    Patient Active Problem List   Diagnosis Date Noted  . Generalized abdominal pain 12/19/2018  . Contraception management 11/28/2017  . Screen for STD (sexually transmitted disease) 02/28/2017  . Vaginal candidiasis 11/15/2014    Past Surgical History:  Procedure Laterality Date  . LAPAROSCOPIC APPENDECTOMY N/A 11/08/2014   Procedure: APPENDECTOMY LAPAROSCOPIC;  Surgeon: Leonia Corona, MD;  [facility];  Service: Pediatrics;  Laterality: N/A;    OB History   No obstetric history on file.      Home Medications    Prior to Admission medications   Medication Sig Start Date End Date Taking? Authorizing Provider  cetirizine (ZYRTEC) 10 MG tablet Take 1 tablet (10 mg total) by mouth daily as needed for allergies. 12/21/18   Latrelle Dodrill, MD  clotrimazole (GYNE-LOTRIMIN) 1 % vaginal cream Place 1 Applicatorful vaginally at bedtime. Twice weekly 10/19/19   Wieters, Hallie C, PA-C  cyclobenzaprine (FLEXERIL) 10 MG tablet Take 0.5 tablets (5 mg total) by mouth at bedtime. 07/25/19   Bast, Gloris Manchester A, NP  ipratropium (ATROVENT) 0.06 % nasal spray Place 2 sprays into both nostrils 4 (four) times daily. 07/12/18   Cathie Hoops, Amy V, PA-C  naproxen (NAPROSYN) 500 MG tablet Take 1 tablet (500 mg total) by mouth 2 (two) times daily. 07/25/19   Dahlia Byes A, NP  norelgestromin-ethinyl estradiol (ORTHO  EVRA) 150-35 MCG/24HR transdermal patch Place 1 patch onto the skin once a week. 12/18/18 07/25/19  Joana Reamer, DO    Family History Family History  Problem Relation Age of Onset  . Hypertension Mother   . Asthma Sister   . Hypertension Maternal Grandmother   . Arthritis Maternal Grandmother   . Arthritis Maternal Grandfather     Social History Social History   Tobacco Use  . Smoking status: Never Smoker  . Smokeless tobacco: Never Used  Vaping Use  . Vaping Use: Never used  Substance Use Topics  . Alcohol use: No  . Drug use: No     Allergies   Penicillins, Amoxicillin, and Tramadol   Review of Systems Review of Systems See HPI Physical Exam Triage Vital Signs ED Triage Vitals  Enc Vitals Group     BP 11/30/19 0906 (!) 108/62     Pulse Rate 11/30/19 0906 81     Resp 11/30/19 0906 14     Temp 11/30/19 0906 98.5 F (36.9 C)     Temp src --      SpO2 11/30/19 0906 100 %     Weight --      Height --      Head Circumference --      Peak Flow --      Pain Score 11/30/19 0904 6     Pain Loc --      Pain Edu? --  Excl. in GC? --    No data found.  Updated Vital Signs BP (!) 108/62   Pulse 81   Temp 98.5 F (36.9 C)   Resp 14   LMP 11/08/2019 (Within Days)   SpO2 100%   Visual Acuity Right Eye Distance:   Left Eye Distance:   Bilateral Distance:    Right Eye Near:   Left Eye Near:    Bilateral Near:     Physical Exam Constitutional:      General: She is not in acute distress.    Appearance: She is well-developed.  HENT:     Head: Normocephalic and atraumatic.     Right Ear: Tympanic membrane and ear canal normal.     Left Ear: Tympanic membrane and ear canal normal.     Nose: No congestion.     Mouth/Throat:     Mouth: Mucous membranes are moist.     Pharynx: Posterior oropharyngeal erythema present.     Tonsils: No tonsillar exudate. 1+ on the right. 1+ on the left.  Eyes:     Conjunctiva/sclera: Conjunctivae normal.      Pupils: Pupils are equal, round, and reactive to light.  Cardiovascular:     Rate and Rhythm: Normal rate.  Pulmonary:     Effort: Pulmonary effort is normal. No respiratory distress.  Abdominal:     General: There is no distension.     Palpations: Abdomen is soft.  Musculoskeletal:        General: Normal range of motion.     Cervical back: Normal range of motion.  Lymphadenopathy:     Cervical: No cervical adenopathy.  Skin:    General: Skin is warm and dry.  Neurological:     General: No focal deficit present.     Mental Status: She is alert.  Psychiatric:        Mood and Affect: Mood normal.        Behavior: Behavior normal.      UC Treatments / Results  Labs (all labs ordered are listed, but only abnormal results are displayed) Labs Reviewed  POCT RAPID STREP A  Strep is negative  EKG   Radiology No results found.  Procedures Procedures (including critical care time)  Medications Ordered in UC Medications - No data to display  Initial Impression / Assessment and Plan / UC Course  I have reviewed the triage vital signs and the nursing notes.  Pertinent labs & imaging results that were available during my care of the patient were reviewed by me and considered in my medical decision making (see chart for details).     *Reviewed that she has a virus and that antibiotics will not help.  She is told she will be called if her throat culture is positive Final Clinical Impressions(s) / UC Diagnoses   Final diagnoses:  Viral pharyngitis     Discharge Instructions     Take Tylenol or ibuprofen for pain Can try salt water gargles   ED Prescriptions    None     PDMP not reviewed this encounter.   Eustace Moore, MD 11/30/19 [phone]

## 2019-11-30 NOTE — Discharge Instructions (Addendum)
Take Tylenol or ibuprofen for pain Can try salt water gargles

## 2019-11-30 NOTE — ED Triage Notes (Signed)
Patient c/o sore throat x1 day. Denies concern for covid.  Denies: cough, ShOB, chest pain, fever, chills, body aches.

## 2019-12-02 LAB — CULTURE, GROUP A STREP (THRC)

## 2019-12-04 ENCOUNTER — Other Ambulatory Visit: Payer: Self-pay

## 2019-12-04 ENCOUNTER — Ambulatory Visit (INDEPENDENT_AMBULATORY_CARE_PROVIDER_SITE_OTHER): Payer: Self-pay | Admitting: Family Medicine

## 2019-12-04 VITALS — BP 110/80 | HR 81

## 2019-12-04 DIAGNOSIS — J069 Acute upper respiratory infection, unspecified: Secondary | ICD-10-CM | POA: Insufficient documentation

## 2019-12-04 DIAGNOSIS — J029 Acute pharyngitis, unspecified: Secondary | ICD-10-CM | POA: Insufficient documentation

## 2019-12-04 DIAGNOSIS — Z113 Encounter for screening for infections with a predominantly sexual mode of transmission: Secondary | ICD-10-CM

## 2019-12-04 NOTE — Patient Instructions (Addendum)
Thank you for coming to see me today. It was a pleasure to see you.   I believe that you have a viral upper respiratory infection.  This can usually last 7 to 10 days and the cough can sometimes linger even longer.  Continue Mucinex, Tylenol/ibuprofen as needed, Zyrtec, Flonase daily.  Be sure to stay well-hydrated.  You should return if your symptoms appear to be improving and then get worse, you develop a productive green/yellow cough, high fevers, inability to stay hydrated.  Please get COVID tested at your earliest convenience.  Once you feel better I recommend that you get the vaccine. You may go to the [facility] campus on Monday or Wednesday to your nearest pharmacy to get tested.  If you have any questions or concerns, please do not hesitate to call the office at [phone].  Take Care,  Dr. Orpah Cobb, DO Resident Physician [facility] [phone]

## 2019-12-04 NOTE — Progress Notes (Signed)
   Subjective:   Patient ID: [name]    DOB: 2000-06-14, 19 y.o. female   MRN: [id number]  [name] is a 19 y.o. female  here for cough, sore throat.  Sore Throat, Cough, Congestion:  Patient presenting with sore throat, headache, runny nose, congestion, intermittent cough, and fatigue with subjective fevers since Friday.  She has been using Tylenol, Mucinex, Zyrtec, chloraseptic spray, and cough drops with minimal relief.  She has not been Covid vaccinated. She is sexually active with one female partner but denies any oral intercourse.  She would like to have HIV and syphilis testing today.  She notes that she is eating, drinking well.  Denies shortness of breath or difficulty breathing.  Denies difficulty swallowing.  Review of Systems:  Per HPI.   Objective:   BP 110/80   Pulse 81   LMP 12/04/2019 (Exact Date)   SpO2 100%  Vitals and nursing note reviewed.  General: pleasant young female,  sitting comfortably in exam chair,  well nourished, well developed, in no acute distress with non-toxic appearance HEENT:  moist mucous membranes, oropharynx with some cobblestoning otherwise no exudate or inflamed tonsils, normal tympanic membranes bilaterally with small level of clear fluid otherwise good cone of light without erythema or bulging bilaterally Resp: Breathing comfortably on room air, speaking full sentences Skin: warm, dry MSK:  gait normal Neuro: Alert and oriented, speech normal  Assessment & Plan:   Sore throat History and physical exam appear most consistent with viral URI with cough.  Strep test on 11/30/2019 negative.  Patient is overall well appearing on exam, afebrile with no red flag symptoms.  Given rise in COVID cases and lack of vaccination, recommended COVID testing. Discussed Mono spot test however patient opted to wait at this time.   Patient is eating, drinking, voiding, and stooling normally. Patient may trial Zyrtec, mucinex, cough/cold medicine.  Tylenol/Ibuprofen PRN for discomfort. She may try honey to help with her cough if desired. Strict return precautions discussed. Patient voiced understanding and agreement with plan.  Screen for STD (sexually transmitted disease) Patient desires STD testing. Recommended she follow-up once acute illness has resolved for further evaluation and testing. She voiced understanding and agreement with plan  Viral URI with cough See plan above.  Orpah Cobb, DO PGY-3, [facility] 12/04/2019 8:55 PM

## 2019-12-04 NOTE — Assessment & Plan Note (Signed)
See plan above.

## 2019-12-04 NOTE — Assessment & Plan Note (Signed)
History and physical exam appear most consistent with viral URI with cough.  Strep test on 11/30/2019 negative.  Patient is overall well appearing on exam, afebrile with no red flag symptoms.  Given rise in COVID cases and lack of vaccination, recommended COVID testing. Discussed Mono spot test however patient opted to wait at this time.   Patient is eating, drinking, voiding, and stooling normally. Patient may trial Zyrtec, mucinex, cough/cold medicine. Tylenol/Ibuprofen PRN for discomfort. She may try honey to help with her cough if desired. Strict return precautions discussed. Patient voiced understanding and agreement with plan.

## 2019-12-04 NOTE — Assessment & Plan Note (Signed)
Patient desires STD testing. Recommended she follow-up once acute illness has resolved for further evaluation and testing. She voiced understanding and agreement with plan

## 2019-12-28 ENCOUNTER — Other Ambulatory Visit: Payer: Self-pay

## 2019-12-28 ENCOUNTER — Other Ambulatory Visit (HOSPITAL_COMMUNITY)
Admission: RE | Admit: 2019-12-28 | Discharge: 2019-12-28 | Disposition: A | Discharge status: Home / Self Care | Payer: Medicaid Other | Source: Ambulatory Visit | Attending: Family Medicine | Admitting: Family Medicine

## 2019-12-28 ENCOUNTER — Ambulatory Visit (INDEPENDENT_AMBULATORY_CARE_PROVIDER_SITE_OTHER): Payer: Self-pay | Admitting: Family Medicine

## 2019-12-28 ENCOUNTER — Encounter: Payer: Self-pay | Admitting: Family Medicine

## 2019-12-28 VITALS — BP 110/78 | HR 88 | Wt 116.0 lb

## 2019-12-28 DIAGNOSIS — B379 Candidiasis, unspecified: Secondary | ICD-10-CM

## 2019-12-28 DIAGNOSIS — Z113 Encounter for screening for infections with a predominantly sexual mode of transmission: Secondary | ICD-10-CM | POA: Diagnosis not present

## 2019-12-28 LAB — POCT WET PREP (WET MOUNT)
Clue Cells Wet Prep Whiff POC: NEGATIVE
Trichomonas Wet Prep HPF POC: ABSENT

## 2019-12-28 MED ORDER — FLUCONAZOLE 150 MG PO TABS: 150.0000 mg | ORAL_TABLET | Freq: Once | ORAL | 1 refills | Status: AC

## 2019-12-28 NOTE — Patient Instructions (Signed)
It was great seeing you today!  Visit Remembers: - Stop by the pharmacy to pick up your prescriptions  - Continue to work on your healthy eating habits and incorporating exercise into your daily life.   Regarding lab work today:  Due to recent changes in healthcare laws, you may see the results of your imaging and laboratory studies on MyChart before your provider has had a chance to review them.  I understand that in some cases there may be results that are confusing or concerning to you. Not all laboratory results come back in the same time frame and you may be waiting for multiple results in order to interpret others.  Please give us 72 hours in order for your provider to thoroughly review all the results before contacting the office for clarification of your results. If everything is normal, you will get a letter in the mail or a message in My Chart. Please give us a call if you do not hear from us after 2 weeks.  Please bring all of your medications with you to each visit.    If you haven't already, sign up for My Chart to have easy access to your labs results, and communication with your primary care physician.  Feel free to call with any questions or concerns at any time, at [phone].   Take care,  Dr. Makynzi Eastland [facility]  

## 2019-12-28 NOTE — Progress Notes (Signed)
   SUBJECTIVE:   CHIEF COMPLAINT / HPI:   Chief Complaint  Patient presents with  . Vaginal Discharge     [name] is a 19 y.o. female here for STI check.   STI check - recently became sexually active with a new partner, wants to be checked for STIs. Hx of chlamydia, BV, yeast infections.  Reports her partner has no symptoms. - Symptoms: vaginal discharge and irritation - Medications tried: none  - Sexually active with 1 female partner - Last sexual encounter: 2 days ago, unprotected - Contraception: occassionally condoms  -Patient's last menstrual period was 12/04/2019 (exact date).  Symptoms - Abnormal vaginal discharge: yellow discharge today  - Missed period: no  - Fever: no - Abdominal/Pelvic pain: no - Vaginal bleeding: no - Pain during sex: no - Rash: no - Dysuria: yes - Back pain: no - Odor: yes, "mildew"  Non smoker   PERTINENT  PMH / PSH: reviewed and updated as appropriate   OBJECTIVE:   BP 110/78   Pulse 88   Wt 116 lb (52.6 kg)   LMP 12/04/2019 (Exact Date)   SpO2 98%   BMI 21.92 kg/m    GEN: well appearing female in no acute distress  CVS: well perfused  RESP: speaking in full sentences without pause  ABD: soft, non-tender, non-distended, no palpable masses  Pelvic exam: normal external genitalia, vulva, VAGINA and CERVIX: lesions absent, cervical discharge present - curd-like and thick, cervical motion tenderness absent, nulliparous os, ADNEXA: normal adnexa in size, nontender and no masses, WET MOUNT done - results: excessive bacteria, positive for yeast, exam chaperoned by CMA.    ASSESSMENT/PLAN:   Screen for STD (sexually transmitted disease) High risk heterosexual behavior GC and chlamydia DNA  probe sent to lab. HIV, Hep C and RPR collected. Advised patient to use barrier protection/condoms.        Yeast infection  Confirmed on wet prep. Symptoms consistent with this.  - Treatment: Diflucan - F/U if symptoms not improving or  getting worse.  - Self care instructions given including avoiding douching.  - F/U with PCP as needed.  - Return precautions including abdominal pain, fever, chills, nausea, or vomiting given.      Katha Cabal, DO PGY-2, [facility] 12/28/2019

## 2019-12-29 LAB — HIV ANTIBODY (ROUTINE TESTING W REFLEX): HIV Screen 4th Generation wRfx: NONREACTIVE

## 2019-12-29 LAB — HEPATITIS C ANTIBODY: Hep C Virus Ab: <0.1 s/co ratio (ref 0.0–0.9)

## 2019-12-29 LAB — RPR: RPR Ser Ql: NONREACTIVE

## 2019-12-31 LAB — CERVICOVAGINAL ANCILLARY ONLY
Chlamydia: NEGATIVE
Comment: NEGATIVE
Comment: NORMAL
Neisseria Gonorrhea: NEGATIVE

## 2020-01-01 ENCOUNTER — Encounter: Payer: Self-pay | Admitting: Family Medicine

## 2020-01-01 DIAGNOSIS — B3731 Acute candidiasis of vulva and vagina: Secondary | ICD-10-CM | POA: Insufficient documentation

## 2020-01-01 DIAGNOSIS — B379 Candidiasis, unspecified: Secondary | ICD-10-CM | POA: Insufficient documentation

## 2020-01-01 NOTE — Assessment & Plan Note (Signed)
Confirmed on wet prep. Symptoms consistent with this.  - Treatment: Diflucan - F/U if symptoms not improving or getting worse.  - Self care instructions given including avoiding douching.  - F/U with PCP as needed.  - Return precautions including abdominal pain, fever, chills, nausea, or vomiting given.

## 2020-01-01 NOTE — Assessment & Plan Note (Signed)
High risk heterosexual behavior GC and chlamydia DNA  probe sent to lab. HIV, Hep C and RPR collected. Advised patient to use barrier protection/condoms.

## 2020-01-29 ENCOUNTER — Other Ambulatory Visit (HOSPITAL_COMMUNITY)
Admission: RE | Admit: 2020-01-29 | Discharge: 2020-01-29 | Disposition: A | Discharge status: Home / Self Care | Payer: No Typology Code available for payment source | Source: Ambulatory Visit | Attending: Family Medicine | Admitting: Family Medicine

## 2020-01-29 ENCOUNTER — Other Ambulatory Visit: Payer: Self-pay

## 2020-01-29 ENCOUNTER — Ambulatory Visit (INDEPENDENT_AMBULATORY_CARE_PROVIDER_SITE_OTHER): Payer: Self-pay | Admitting: Student in an Organized Health Care Education/Training Program

## 2020-01-29 VITALS — BP 115/75 | HR 75 | Ht 61.0 in | Wt 114.2 lb

## 2020-01-29 DIAGNOSIS — N898 Other specified noninflammatory disorders of vagina: Secondary | ICD-10-CM

## 2020-01-29 LAB — POCT WET PREP (WET MOUNT)
Clue Cells Wet Prep Whiff POC: NEGATIVE
Trichomonas Wet Prep HPF POC: ABSENT

## 2020-01-29 NOTE — Progress Notes (Signed)
    SUBJECTIVE:   CHIEF COMPLAINT / HPI: vaginal discharge  Approximately 4 days ago patient began having thick yellowish-green discharge.  She used over-the-counter antifungal suppository to treat for a yeast infection but continues to have thick white discharge and vaginal irritation.  She began her menstrual cycle 2 days ago.  She is sexually active and has unprotected sex with 1 female partner.  Had negative STD screening approximately a month ago which was negative and has had no known new exposures to STDs.  She declines HIV and RPR testing today.  OBJECTIVE:   BP 115/75   Pulse 75   Ht 5\' 1"  (1.549 m)   Wt 114 lb 3.2 oz (51.8 kg)   LMP 01/29/2020 (Exact Date)   SpO2 98%   BMI 21.58 kg/m   General: NAD, pleasant, able to participate in exam Pelvic: Negative for external genital lesions.  Small amount of bleeding in vaginal canal but no excessive discharge.  Cervix appears normal. Skin: warm and dry, no rashes noted Neuro: alert and oriented, no focal deficits Psych: Normal affect and mood  ASSESSMENT/PLAN:   Vaginal discharge Wet prep and gc/chlam normal. Patient informed.  Offered HIV/RPR and patient declined     01/31/2020, DO [facility]

## 2020-01-29 NOTE — Patient Instructions (Signed)
It was a pleasure to see you today!  To summarize our discussion for this visit:  Today we swabbed for STI, yeast, BV. I will message you with the results.   Some additional health maintenance measures we should update are: Health Maintenance Due  Topic Date Due  . COVID-19 Vaccine (1) Never done  . TETANUS/TDAP  Never done  . INFLUENZA VACCINE  Never done  .   Call the clinic at [phone] if your symptoms worsen or you have any concerns.   Thank you for allowing me to take part in your care,  Dr. Jamelle Rushing

## 2020-01-30 ENCOUNTER — Ambulatory Visit: Payer: Medicaid Other | Admitting: Family Medicine

## 2020-01-30 LAB — CERVICOVAGINAL ANCILLARY ONLY
Chlamydia: NEGATIVE
Comment: NEGATIVE
Comment: NORMAL
Neisseria Gonorrhea: NEGATIVE

## 2020-01-31 NOTE — Assessment & Plan Note (Signed)
Wet prep and gc/chlam normal. Patient informed.  Offered HIV/RPR and patient declined

## 2020-02-02 ENCOUNTER — Ambulatory Visit (HOSPITAL_COMMUNITY)
Admission: EM | Admit: 2020-02-02 | Discharge: 2020-02-02 | Disposition: A | Discharge status: Home / Self Care | Payer: No Typology Code available for payment source | Attending: Urgent Care | Admitting: Urgent Care

## 2020-02-02 ENCOUNTER — Other Ambulatory Visit: Payer: Self-pay

## 2020-02-02 ENCOUNTER — Encounter (HOSPITAL_COMMUNITY): Payer: Self-pay

## 2020-02-02 DIAGNOSIS — J029 Acute pharyngitis, unspecified: Secondary | ICD-10-CM

## 2020-02-02 DIAGNOSIS — Z791 Long term (current) use of non-steroidal anti-inflammatories (NSAID): Secondary | ICD-10-CM | POA: Diagnosis not present

## 2020-02-02 DIAGNOSIS — Z79899 Other long term (current) drug therapy: Secondary | ICD-10-CM | POA: Insufficient documentation

## 2020-02-02 DIAGNOSIS — J069 Acute upper respiratory infection, unspecified: Secondary | ICD-10-CM | POA: Diagnosis not present

## 2020-02-02 DIAGNOSIS — Z20822 Contact with and (suspected) exposure to covid-19: Secondary | ICD-10-CM | POA: Diagnosis not present

## 2020-02-02 LAB — POCT RAPID STREP A, ED / UC: Streptococcus, Group A Screen (Direct): NEGATIVE

## 2020-02-02 MED ORDER — LIDOCAINE VISCOUS HCL 2 % MT SOLN: 15.0000 mL | OROMUCOSAL | 0 refills | Status: DC | PRN

## 2020-02-02 NOTE — ED Provider Notes (Signed)
[facility]    CSN: [id number] Arrival date & time: 02/02/20  1007      History   Chief Complaint Chief Complaint  Patient presents with  . Sore Throat    HPI [name] is a 19 y.o. female.   Generalized body aches, low grade fever, sore throat, fatigue x 1 day. Denies abdominal pain, N/V/D, rashes, cough, CP, SOB. Has been trying throat drops, throat spray, honey tea without relief. No known sick contacts. Not UTD on covid vaccines. Does have a hx of allergic rhinitis, compliant with allergy regimen.      Past Medical History:  Diagnosis Date  . Screen for STD (sexually transmitted disease) 02/28/2017  . Seasonal allergies   . Vaginal bleeding 09/17/2016  . Vulvovaginal candidiasis 11/15/2014    Patient Active Problem List   Diagnosis Date Noted  . Yeast infection 01/01/2020  . Sore throat 12/04/2019  . Contraception management 11/28/2017  . Vaginal discharge 09/30/2017  . Screen for STD (sexually transmitted disease) 02/28/2017    Past Surgical History:  Procedure Laterality Date  . LAPAROSCOPIC APPENDECTOMY N/A 11/08/2014   Procedure: APPENDECTOMY LAPAROSCOPIC;  Surgeon: Leonia Corona, MD;  Location: [facility];  Service: Pediatrics;  Laterality: N/A;    OB History   No obstetric history on file.      Home Medications    Prior to Admission medications   Medication Sig Start Date End Date Taking? Authorizing Provider  cetirizine (ZYRTEC) 10 MG tablet Take 1 tablet (10 mg total) by mouth daily as needed for allergies. 12/21/18   Latrelle Dodrill, MD  cyclobenzaprine (FLEXERIL) 10 MG tablet Take 0.5 tablets (5 mg total) by mouth at bedtime. 07/25/19   Bast, Gloris Manchester A, NP  ipratropium (ATROVENT) 0.06 % nasal spray Place 2 sprays into both nostrils 4 (four) times daily. 07/12/18   Cathie Hoops, Amy V, PA-C  lidocaine (XYLOCAINE) 2 % solution Use as directed 15 mLs in the mouth or throat as needed for mouth pain. 02/02/20   Particia Nearing, PA-C  naproxen  (NAPROSYN) 500 MG tablet Take 1 tablet (500 mg total) by mouth 2 (two) times daily. 07/25/19   Dahlia Byes A, NP  norelgestromin-ethinyl estradiol (ORTHO EVRA) 150-35 MCG/24HR transdermal patch Place 1 patch onto the skin once a week. 12/18/18 07/25/19  Joana Reamer, DO    Family History Family History  Problem Relation Age of Onset  . Hypertension Mother   . Asthma Sister   . Hypertension Maternal Grandmother   . Arthritis Maternal Grandmother   . Arthritis Maternal Grandfather     Social History Social History   Tobacco Use  . Smoking status: Never Smoker  . Smokeless tobacco: Never Used  Vaping Use  . Vaping Use: Never used  Substance Use Topics  . Alcohol use: No  . Drug use: No     Allergies   Penicillins, Amoxicillin, and Tramadol   Review of Systems Review of Systems PER HPI    Physical Exam Triage Vital Signs ED Triage Vitals  Enc Vitals Group     BP 02/02/20 1054 110/61     Pulse Rate 02/02/20 1054 (!) 101     Resp 02/02/20 1054 16     Temp 02/02/20 1054 99.5 F (37.5 C)     Temp Source 02/02/20 1054 Oral     SpO2 02/02/20 1054 100 %     Weight 02/02/20 1057 114 lb (51.7 kg)     Height 02/02/20 1057 5\' 1"  (1.549 m)  Head Circumference --      Peak Flow --      Pain Score 02/02/20 1057 7     Pain Loc --      Pain Edu? --      Excl. in GC? --    No data found.  Updated Vital Signs BP 110/61   Pulse (!) 101   Temp 99.5 F (37.5 C) (Oral)   Resp 16   Ht 5\' 1"  (1.549 m)   Wt 114 lb (51.7 kg)   LMP 01/29/2020 (Exact Date)   SpO2 100%   BMI 21.54 kg/m   Visual Acuity Right Eye Distance:   Left Eye Distance:   Bilateral Distance:    Right Eye Near:   Left Eye Near:    Bilateral Near:     Physical Exam Vitals and nursing note reviewed.  Constitutional:      Appearance: Normal appearance. She is not ill-appearing.  HENT:     Head: Atraumatic.     Right Ear: Tympanic membrane normal.     Left Ear: Tympanic membrane normal.       Nose:     Comments: Nasal mucosa erythematous and mildly edematous    Mouth/Throat:     Mouth: Mucous membranes are moist.     Pharynx: Posterior oropharyngeal erythema present. No oropharyngeal exudate.  Eyes:     Extraocular Movements: Extraocular movements intact.     Conjunctiva/sclera: Conjunctivae normal.  Cardiovascular:     Rate and Rhythm: Normal rate and regular rhythm.     Heart sounds: Normal heart sounds.  Pulmonary:     Effort: Pulmonary effort is normal.     Breath sounds: Normal breath sounds. No wheezing or rales.  Abdominal:     General: Bowel sounds are normal. There is no distension.     Palpations: Abdomen is soft.     Tenderness: There is no abdominal tenderness.  Musculoskeletal:        General: Normal range of motion.     Cervical back: Normal range of motion and neck supple.  Skin:    General: Skin is warm and dry.  Neurological:     Mental Status: She is alert and oriented to person, place, and time.  Psychiatric:        Mood and Affect: Mood normal.        Thought Content: Thought content normal.        Judgment: Judgment normal.      UC Treatments / Results  Labs (all labs ordered are listed, but only abnormal results are displayed) Labs Reviewed  SARS CORONAVIRUS 2 (TAT 6-24 HRS)  CULTURE, GROUP A STREP [facility])  POCT RAPID STREP A, ED / UC    EKG   Radiology No results found.  Procedures Procedures (including critical care time)  Medications Ordered in UC Medications - No data to display  Initial Impression / Assessment and Plan / UC Course  I have reviewed the triage vital signs and the nursing notes.  Pertinent labs & imaging results that were available during my care of the patient were reviewed by me and considered in my medical decision making (see chart for details).     Here today with 1 day of sore throat and generalized malaise and body aches. Rapid strep neg, COVID pcr and throat culture pending. Will treat  symptomatically with viscous lidocaine, OTC pain relievers prn, rest, hydration. Letter given to quarantine while results pending, discussed full isolation protocol and return precautions.  Final Clinical Impressions(s) / UC Diagnoses   Final diagnoses:  Viral URI   Discharge Instructions   None    ED Prescriptions    Medication Sig Dispense Auth. Provider   lidocaine (XYLOCAINE) 2 % solution Use as directed 15 mLs in the mouth or throat as needed for mouth pain. 100 mL Particia Nearing, [place]     PDMP not reviewed this encounter.   Particia Nearing, [place] 02/02/20 1126

## 2020-02-02 NOTE — ED Triage Notes (Signed)
Pt c/o sore throat that started yesterday. Pt states it's painful when she talks and it feels dry. Pt c/o dry throat. Pt states the sore throat is an ongoing issue for years.

## 2020-02-03 LAB — SARS CORONAVIRUS 2 (TAT 6-24 HRS): SARS Coronavirus 2: NEGATIVE

## 2020-02-04 LAB — CULTURE, GROUP A STREP (THRC)

## 2020-04-18 ENCOUNTER — Encounter: Payer: Self-pay | Admitting: Family Medicine

## 2020-05-10 ENCOUNTER — Other Ambulatory Visit: Payer: Self-pay | Admitting: Family Medicine

## 2020-05-10 DIAGNOSIS — B379 Candidiasis, unspecified: Secondary | ICD-10-CM

## 2020-05-12 NOTE — Telephone Encounter (Signed)
Called patient to discuss -s he reports recurrent symptoms Advised she is due for pap. Scheduled to see me tomorrow AM, will evaluate symptoms at that time.  Latrelle Dodrill, MD

## 2020-05-13 ENCOUNTER — Other Ambulatory Visit (HOSPITAL_COMMUNITY)
Admission: RE | Admit: 2020-05-13 | Discharge: 2020-05-13 | Disposition: A | Discharge status: Home / Self Care | Payer: Medicaid Other | Source: Ambulatory Visit | Attending: Family Medicine | Admitting: Family Medicine

## 2020-05-13 ENCOUNTER — Encounter: Payer: Self-pay | Admitting: Family Medicine

## 2020-05-13 ENCOUNTER — Ambulatory Visit (INDEPENDENT_AMBULATORY_CARE_PROVIDER_SITE_OTHER): Payer: Medicaid Other | Admitting: Family Medicine

## 2020-05-13 ENCOUNTER — Other Ambulatory Visit: Payer: Self-pay

## 2020-05-13 VITALS — BP 110/70 | HR 76 | Ht 61.0 in | Wt 115.2 lb

## 2020-05-13 DIAGNOSIS — Z3009 Encounter for other general counseling and advice on contraception: Secondary | ICD-10-CM

## 2020-05-13 DIAGNOSIS — B379 Candidiasis, unspecified: Secondary | ICD-10-CM | POA: Diagnosis not present

## 2020-05-13 DIAGNOSIS — Z124 Encounter for screening for malignant neoplasm of cervix: Secondary | ICD-10-CM | POA: Insufficient documentation

## 2020-05-13 DIAGNOSIS — Z23 Encounter for immunization: Secondary | ICD-10-CM | POA: Diagnosis not present

## 2020-05-13 DIAGNOSIS — Z113 Encounter for screening for infections with a predominantly sexual mode of transmission: Secondary | ICD-10-CM | POA: Diagnosis not present

## 2020-05-13 DIAGNOSIS — Z01411 Encounter for gynecological examination (general) (routine) with abnormal findings: Secondary | ICD-10-CM

## 2020-05-13 DIAGNOSIS — N898 Other specified noninflammatory disorders of vagina: Secondary | ICD-10-CM | POA: Insufficient documentation

## 2020-05-13 LAB — POCT WET PREP (WET MOUNT)
Clue Cells Wet Prep Whiff POC: NEGATIVE
Trichomonas Wet Prep HPF POC: ABSENT

## 2020-05-13 MED ORDER — FLUCONAZOLE 150 MG PO TABS: 150.0000 mg | ORAL_TABLET | ORAL | 0 refills | Status: AC

## 2020-05-13 NOTE — Patient Instructions (Addendum)
Strongly recommend you get the COVID vaccine - see the handout on this  Pap smear and STD testing today I will call you with results and we'll decide what to do from there  Think about nexplanon - it's a great form of birth control!  Tetanus shot and HPV vaccines today  Be well, Dr. Pollie Meyer   Vaginal Yeast Infection, Adult  Vaginal yeast infection is a condition that causes vaginal discharge as well as soreness, swelling, and redness (inflammation) of the vagina. This is a common condition. Some women get this infection frequently. What are the causes? This condition is caused by a change in the normal balance of the yeast (candida) and bacteria that live in the vagina. This change causes an overgrowth of yeast, which causes the inflammation. What increases the risk? The condition is more likely to develop in women who:  Take antibiotic medicines.  Have diabetes.  Take birth control pills.  Are pregnant.  Douche often.  Have a weak body defense system (immune system).  Have been taking steroid medicines for a long time.  Frequently wear tight clothing. What are the signs or symptoms? Symptoms of this condition include:  White, thick, creamy vaginal discharge.  Swelling, itching, redness, and irritation of the vagina. The lips of the vagina (vulva) may be affected as well.  Pain or a burning feeling while urinating.  Pain during sex. How is this diagnosed? This condition is diagnosed based on:  Your medical history.  A physical exam.  A pelvic exam. Your health care provider will examine a sample of your vaginal discharge under a microscope. Your health care provider may send this sample for testing to confirm the diagnosis. How is this treated? This condition is treated with medicine. Medicines may be over-the-counter or prescription. You may be told to use one or more of the following:  Medicine that is taken by mouth (orally).  Medicine that is applied as  a cream (topically).  Medicine that is inserted directly into the vagina (suppository). Follow these instructions at home:  Lifestyle  Do not have sex until your health care provider approves. Tell your sex partner that you have a yeast infection. That person should go to his or her health care provider and ask if they should also be treated.  Do not wear tight clothes, such as pantyhose or tight pants.  Wear breathable cotton underwear. General instructions  Take or apply over-the-counter and prescription medicines only as told by your health care provider.  Eat more yogurt. This may help to keep your yeast infection from returning.  Do not use tampons until your health care provider approves.  Try taking a sitz bath to help with discomfort. This is a warm water bath that is taken while you are sitting down. The water should only come up to your hips and should cover your buttocks. Do this 3-4 times per day or as told by your health care provider.  Do not douche.  If you have diabetes, keep your blood sugar levels under control.  Keep all follow-up visits as told by your health care provider. This is important. Contact a health care provider if:  You have a fever.  Your symptoms go away and then return.  Your symptoms do not get better with treatment.  Your symptoms get worse.  You have new symptoms.  You develop blisters in or around your vagina.  You have blood coming from your vagina and it is not your menstrual period.  You  develop pain in your abdomen. Summary  Vaginal yeast infection is a condition that causes discharge as well as soreness, swelling, and redness (inflammation) of the vagina.  This condition is treated with medicine. Medicines may be over-the-counter or prescription.  Take or apply over-the-counter and prescription medicines only as told by your health care provider.  Do not douche. Do not have sex or use tampons until your health care  provider approves.  Contact a health care provider if your symptoms do not get better with treatment or your symptoms go away and then return. This information is not intended to replace advice given to you by your health care provider. Make sure you discuss any questions you have with your health care provider. Document Revised: 11/24/2018 Document Reviewed: 09/12/2017 Elsevier Patient Education  2020 [employer].     Health Maintenance, Female Adopting a healthy lifestyle and getting preventive care are important in promoting health and wellness. Ask your health care provider about:  The right schedule for you to have regular tests and exams.  Things you can do on your own to prevent diseases and keep yourself healthy. What should I know about diet, weight, and exercise? Eat a healthy diet   Eat a diet that includes plenty of vegetables, fruits, low-fat dairy products, and lean protein.  Do not eat a lot of foods that are high in solid fats, added sugars, or sodium. Maintain a healthy weight Body mass index (BMI) is used to identify weight problems. It estimates body fat based on height and weight. Your health care provider can help determine your BMI and help you achieve or maintain a healthy weight. Get regular exercise Get regular exercise. This is one of the most important things you can do for your health. Most adults should:  Exercise for at least 150 minutes each week. The exercise should increase your heart rate and make you sweat (moderate-intensity exercise).  Do strengthening exercises at least twice a week. This is in addition to the moderate-intensity exercise.  Spend less time sitting. Even light physical activity can be beneficial. Watch cholesterol and blood lipids Have your blood tested for lipids and cholesterol at 22 years of age, then have this test every 5 years. Have your cholesterol levels checked more often if:  Your lipid or cholesterol levels are  high.  You are older than 22 years of age.  You are at high risk for heart disease. What should I know about cancer screening? Depending on your health history and family history, you may need to have cancer screening at various ages. This may include screening for:  Breast cancer.  Cervical cancer.  Colorectal cancer.  Skin cancer.  Lung cancer. What should I know about heart disease, diabetes, and high blood pressure? Blood pressure and heart disease  High blood pressure causes heart disease and increases the risk of stroke. This is more likely to develop in people who have high blood pressure readings, are of African descent, or are overweight.  Have your blood pressure checked: ? Every 3-5 years if you are 73-67 years of age. ? Every year if you are 76 years old or older. Diabetes Have regular diabetes screenings. This checks your fasting blood sugar level. Have the screening done:  Once every three years after age 22 if you are at a normal weight and have a low risk for diabetes.  More often and at a younger age if you are overweight or have a high risk for diabetes. What should  I know about preventing infection? Hepatitis B If you have a higher risk for hepatitis B, you should be screened for this virus. Talk with your health care provider to find out if you are at risk for hepatitis B infection. Hepatitis C Testing is recommended for:  Everyone born from [birth year] through 1967.  Anyone with known risk factors for hepatitis C. Sexually transmitted infections (STIs)  Get screened for STIs, including gonorrhea and chlamydia, if: ? You are sexually active and are younger than 22 years of age. ? You are older than 22 years of age and your health care provider tells you that you are at risk for this type of infection. ? Your sexual activity has changed since you were last screened, and you are at increased risk for chlamydia or gonorrhea. Ask your health care provider if you  are at risk.  Ask your health care provider about whether you are at high risk for HIV. Your health care provider may recommend a prescription medicine to help prevent HIV infection. If you choose to take medicine to prevent HIV, you should first get tested for HIV. You should then be tested every 3 months for as long as you are taking the medicine. Pregnancy  If you are about to stop having your period (premenopausal) and you may become pregnant, seek counseling before you get pregnant.  Take 400 to 800 micrograms (mcg) of folic acid every day if you become pregnant.  Ask for birth control (contraception) if you want to prevent pregnancy. Osteoporosis and menopause Osteoporosis is a disease in which the bones lose minerals and strength with aging. This can result in bone fractures. If you are 54 years old or older, or if you are at risk for osteoporosis and fractures, ask your health care provider if you should:  Be screened for bone loss.  Take a calcium or vitamin D supplement to lower your risk of fractures.  Be given hormone replacement therapy (HRT) to treat symptoms of menopause. Follow these instructions at home: Lifestyle  Do not use any products that contain nicotine or tobacco, such as cigarettes, e-cigarettes, and chewing tobacco. If you need help quitting, ask your health care provider.  Do not use street drugs.  Do not share needles.  Ask your health care provider for help if you need support or information about quitting drugs. Alcohol use  Do not drink alcohol if: ? Your health care provider tells you not to drink. ? You are pregnant, may be pregnant, or are planning to become pregnant.  If you drink alcohol: ? Limit how much you use to 0-1 drink a day. ? Limit intake if you are breastfeeding.  Be aware of how much alcohol is in your drink. In the [place] one drink equals one 12 oz bottle of beer (355 mL), one 5 oz glass of wine (148 mL), or one 1 oz glass of hard  liquor (44 mL). General instructions  Schedule regular health, dental, and eye exams.  Stay current with your vaccines.  Tell your health care provider if: ? You often feel depressed. ? You have ever been abused or do not feel safe at home. Summary  Adopting a healthy lifestyle and getting preventive care are important in promoting health and wellness.  Follow your health care provider's instructions about healthy diet, exercising, and getting tested or screened for diseases.  Follow your health care provider's instructions on monitoring your cholesterol and blood pressure. This information is not intended to replace advice  given to you by your health care provider. Make sure you discuss any questions you have with your health care provider. Document Revised: 04/19/2018 Document Reviewed: 04/19/2018 Elsevier Patient Education  2020 [employer].

## 2020-05-13 NOTE — Progress Notes (Signed)
  Date of Visit: 05/13/2020   SUBJECTIVE:   HPI:  Briea presents today for a well woman exam and to discuss vaginal discomfort  Concerns today: see below Periods: monthly, does get cramping Contraception: uses condoms but not every time. Does not desire pregnancy. Cannot remember to take a pill at the same time every day. Has heard bad things about nexplanon, not interested in IUD. Previously on depo. Pelvic symptoms: vaginal/vulvar discomfort, some thick white discharge, has used monistat. History of the same (yeast infections) Sexual activity: 2 female partners in the last year STD Screening: agreeable to gc/chl, declines HIV/RPR Pap smear status: due for first pap today Exercise: does not exercise regularly, encouraged to do this Dentist: encouraged to go twice a year Cancers in family: grandma with possible colon cancer, aunt with breast cancer, no first degree relatives with cancer  OBJECTIVE:   BP 110/70   Pulse 76   Ht 5\' 1"  (1.549 m)   Wt 115 lb 3.2 oz (52.3 kg)   LMP 04/28/2020   SpO2 100%   BMI 21.77 kg/m  Gen: NAD, pleasant, cooperative HEENT: NCAT, PERRL, no palpable thyromegaly or anterior cervical lymphadenopathy Heart: RRR, no murmurs Lungs: CTAB, NWOB Abdomen: soft, nontender to palpation Neuro: grossly nonfocal, speech normal GU: normal appearing external genitalia without lesions. Vagina is moist with white discharge. Cervix normal in appearance. No cervical motion tenderness or tenderness on bimanual exam. No adnexal masses. Chaperone Shelly present for exam.  ASSESSMENT/PLAN:   Health maintenance:  -STD screening: gc/chl/trich obtained today -pap smear: cytology obtained today -encouraged regular exercise/activity -encouraged dental visit -immunizations:  . Flu: declines . Tdap: agreeable to getting today, but unfortunately we were out of Tdap vaccines . COVID: strongly encouraged COVID vaccine. She is not ready today. Given handout with reliable  info on vaccines . HPV: per review of [facility], only had one dose of gardasil in 2011. Second dose given today to complete series -handout given on health maintenance topics  Yeast infection Called patient after visit to discuss wet prep results Wet prep with yeast. This is her third documented yeast infection in the last year.  Will treat with diflucan 150mg  q3 days x3 doses.  If recurs after this, would obtain vaginal culture to rule out non-albicans form of candida May benefit from weekly suppressive diflucan therapy if confirmed to be candida albicans  Contraception management Discussed options in detail. Patient declines any specific form of contraception today. Encouraged her to think about nexplanon.  FOLLOW UP: Follow up in 1 year for next CPE, sooner if needed  2012 J. , MD [facility]

## 2020-05-14 LAB — CERVICOVAGINAL ANCILLARY ONLY
Chlamydia: NEGATIVE
Comment: NEGATIVE
Comment: NEGATIVE
Comment: NORMAL
Neisseria Gonorrhea: NEGATIVE
Trichomonas: NEGATIVE

## 2020-05-14 LAB — CYTOLOGY - PAP: Diagnosis: NEGATIVE

## 2020-05-14 NOTE — Assessment & Plan Note (Signed)
Called patient after visit to discuss wet prep results Wet prep with yeast. This is her third documented yeast infection in the last year.  Will treat with diflucan 150mg  q3 days x3 doses.  If recurs after this, would obtain vaginal culture to rule out non-albicans form of candida May benefit from weekly suppressive diflucan therapy if confirmed to be candida albicans

## 2020-05-14 NOTE — Assessment & Plan Note (Signed)
Discussed options in detail. Patient declines any specific form of contraception today. Encouraged her to think about nexplanon.

## 2020-05-20 ENCOUNTER — Ambulatory Visit: Payer: Medicaid Other

## 2020-05-21 ENCOUNTER — Ambulatory Visit (INDEPENDENT_AMBULATORY_CARE_PROVIDER_SITE_OTHER): Payer: Self-pay | Admitting: Family Medicine

## 2020-05-21 ENCOUNTER — Encounter: Payer: Self-pay | Admitting: Family Medicine

## 2020-05-21 ENCOUNTER — Other Ambulatory Visit: Payer: Self-pay

## 2020-05-21 ENCOUNTER — Other Ambulatory Visit (HOSPITAL_COMMUNITY)
Admission: RE | Admit: 2020-05-21 | Discharge: 2020-05-21 | Disposition: A | Discharge status: Home / Self Care | Payer: Medicaid Other | Source: Ambulatory Visit | Attending: Family Medicine | Admitting: Family Medicine

## 2020-05-21 VITALS — BP 109/70 | HR 81 | Ht 61.0 in | Wt 117.2 lb

## 2020-05-21 DIAGNOSIS — B9689 Other specified bacterial agents as the cause of diseases classified elsewhere: Secondary | ICD-10-CM | POA: Insufficient documentation

## 2020-05-21 DIAGNOSIS — N760 Acute vaginitis: Secondary | ICD-10-CM | POA: Diagnosis not present

## 2020-05-21 DIAGNOSIS — Z3009 Encounter for other general counseling and advice on contraception: Secondary | ICD-10-CM

## 2020-05-21 DIAGNOSIS — Z113 Encounter for screening for infections with a predominantly sexual mode of transmission: Secondary | ICD-10-CM | POA: Insufficient documentation

## 2020-05-21 DIAGNOSIS — N898 Other specified noninflammatory disorders of vagina: Secondary | ICD-10-CM

## 2020-05-21 NOTE — Assessment & Plan Note (Signed)
Recurrent discharge/odor despite recent diflucan x3 treatment for candida, no known STD exposure. Collected Gc/ch, trich, BV, and candida with speciation (wet preps unable to be performed in lab today). Will treat accordingly, suspect may need to trial alternative vaginal yeast tx vs trial suppressive therapy with diflucan.

## 2020-05-21 NOTE — Progress Notes (Signed)
    SUBJECTIVE:   CHIEF COMPLAINT / HPI: Vaginal discharge  [name] is an otherwise healthy 20 year old female presenting for evaluation of vaginal discharge/odor.  She recently was seen for a well woman exam by her PCP, Dr. Pollie Meyer, on 1/4 and noted vaginal discharge at that time as well.  Wet prep positive for yeast, third documented yeast infection in the past few months.  GC/chlamydia negative at that time.  Treated with Diflucan X3.  Today she reports, that her symptoms seemed to get a little bit better, however now she has a new odor to her vaginal discharge and more copious than previous.  Some itching/irritation.  She is sexually active in a monogamous relationship, uses condoms most times.  Not currently on any birth control, previously on Depo, however is not interested in anything at this point.  Last active 2 days ago, LMP end of 04/2020.  Denies any associated dysuria, abdominal pain, urinary frequency/urgency, or back pain.  She would like to check for STDs again today via cytology only, no labs.  She has been trying to avoid tight clothing and keeping the area dry.  PERTINENT  PMH / PSH: Recurrent yeast infections.  OBJECTIVE:   BP 109/70   Pulse 81   Ht 5\' 1"  (1.549 m)   Wt 117 lb 3.2 oz (53.2 kg)   LMP 04/28/2020   SpO2 99%   BMI 22.14 kg/m    General: Alert, NAD HEENT: NCAT, MMM Abdomen: soft, non-tender Msk: Moves all extremities spontaneously  Ext: Warm, dry, 2+ distal pulse Pelvic exam: VULVA: normal appearing vulva with no masses, tenderness or lesions, VAGINA: vaginal discharge - white, copious and creamy. No cervical motion tenderness. Chaperoned by CMA.   ASSESSMENT/PLAN:   Vaginal discharge Recurrent discharge/odor despite recent diflucan x3 treatment for candida, no known STD exposure. Collected Gc/ch, trich, BV, and candida with speciation (wet preps unable to be performed in lab today). Will treat accordingly, suspect may need to trial alternative  vaginal yeast tx vs trial suppressive therapy with diflucan.   Contraception management Briefly discussed forms on contraception today, after recent discussion with PCP. She currently is not interested, however considering using Depo injections again. Encouraged condom use with every encounter.     Follow up pending results.   04/30/2020, DO [place]

## 2020-05-21 NOTE — Patient Instructions (Signed)
I will call with results.

## 2020-05-21 NOTE — Assessment & Plan Note (Signed)
Briefly discussed forms on contraception today, after recent discussion with PCP. She currently is not interested, however considering using Depo injections again. Encouraged condom use with every encounter.

## 2020-05-26 ENCOUNTER — Ambulatory Visit: Payer: Medicaid Other

## 2020-05-26 LAB — CERVICOVAGINAL ANCILLARY ONLY
Bacterial Vaginitis (gardnerella): POSITIVE — AB
Candida Glabrata: NEGATIVE
Candida Vaginitis: NEGATIVE
Chlamydia: NEGATIVE
Comment: NEGATIVE
Comment: NEGATIVE
Comment: NEGATIVE
Comment: NEGATIVE
Comment: NEGATIVE
Comment: NORMAL
Neisseria Gonorrhea: NEGATIVE
Trichomonas: NEGATIVE

## 2020-05-27 ENCOUNTER — Other Ambulatory Visit: Payer: Self-pay | Admitting: Family Medicine

## 2020-05-27 DIAGNOSIS — N760 Acute vaginitis: Secondary | ICD-10-CM

## 2020-05-27 DIAGNOSIS — B9689 Other specified bacterial agents as the cause of diseases classified elsewhere: Secondary | ICD-10-CM

## 2020-05-27 MED ORDER — METRONIDAZOLE 0.75 % VA GEL: 1.0000 | Freq: Two times a day (BID) | VAGINAL | 0 refills | Status: AC

## 2020-05-27 MED ORDER — METRONIDAZOLE 0.75 % VA GEL: 1.0000 | Freq: Two times a day (BID) | VAGINAL | 0 refills | Status: DC

## 2020-06-02 ENCOUNTER — Encounter: Payer: Self-pay | Admitting: Family Medicine

## 2020-06-02 ENCOUNTER — Telehealth: Payer: Self-pay

## 2020-06-02 NOTE — Telephone Encounter (Signed)
Patient calls nurse line reporting she is not able to pick up metro gel due to prescriber. Patient reports medicaid will not cover [place]. I attempted to call pharmacy to give verbal credentials for PCP, however they need this on hard copy prescription. Will forward to PCP to resend in.

## 2020-06-03 MED ORDER — METRONIDAZOLE 0.75 % VA GEL: 1.0000 | Freq: Two times a day (BID) | VAGINAL | 0 refills | Status: DC

## 2020-06-03 NOTE — Telephone Encounter (Signed)
Rx sent in via mychart encounter Latrelle Dodrill, MD

## 2020-06-03 NOTE — Telephone Encounter (Signed)
Pt called requested a call back regarding her medication having problems getting refills sent to pharmacy.

## 2020-07-01 NOTE — Progress Notes (Signed)
     SUBJECTIVE:   CHIEF COMPLAINT / HPI:   [name] is a 20 y.o. female presents with vaginal discharge concern for BV.   Vaginal Discharge Patient reports that vaginal discharge started several days ago.  She notes that discharge appears "tan colour" with fishy vaginal odors.    She always gets BV after her menstrual cycle. She has used flagyl applicator without relief.  She denies vaginal pruritis, abnormal vaginal bleeding, dysuria, hematuria, frequency, abdominal pain pelvic pain, nausea, vomiting, fevers or new rash.   History of STIs-chlamydia.  She is sexually active and uses condoms.  Contraception: none.  LMP 14th Feb.  She does not douche.  PERTINENT  PMH / PSH: BV, yeast infections  OBJECTIVE:   BP (!) 98/56   Pulse 95   Ht 5\' 1"  (1.549 m)   Wt 114 lb 3.2 oz (51.8 kg)   LMP 06/23/2020 (Exact Date)   SpO2 98%   BMI 21.58 kg/m    General: Alert, no acute distress Cardio: Well-perfused Pulm:  normal work of breathing Neuro: Cranial nerves grossly intact    Pelvic Exam chaperoned by CMA April         External: normal female genitalia without lesions or masses        Vagina: normal without lesions or masses        Cervix: normal without lesions or masses        Samples for Wet prep, GC/Chlamydia obtained  ASSESSMENT/PLAN:   BV (bacterial vaginosis) Wet prep pending, likely BV.  Patient has had multiple episodes of BV triggered by menses.  Explained that the menstrual cycle can change vaginal pH levels which can then cause BV.  I also explained that treating the BV can cause yeast infections because of the pH changes in the vagina.  Patient is very upset with her recurrent episodes.  Will prescribe treatment for recurrent BV: 10 days of MetroGel followed by twice weekly MetroGel for the next 2 months.  Also prescribed Diflucan in case she gets yeast infections.  Patient would also like to try boric acid for recurrent BV.  I explained that there is insufficient data  to support the use of this but she could try this from May if she would like.[place]  Recommend against ingestion of boric acid as this can cause death.  Recommend follow-up with PCP.    Marland Kitchen, MD PGY-2 [facility]

## 2020-07-02 ENCOUNTER — Other Ambulatory Visit: Payer: Self-pay

## 2020-07-02 ENCOUNTER — Ambulatory Visit (INDEPENDENT_AMBULATORY_CARE_PROVIDER_SITE_OTHER): Payer: Self-pay | Admitting: Family Medicine

## 2020-07-02 ENCOUNTER — Other Ambulatory Visit (HOSPITAL_COMMUNITY)
Admission: RE | Admit: 2020-07-02 | Discharge: 2020-07-02 | Disposition: A | Discharge status: Home / Self Care | Payer: Medicaid Other | Source: Ambulatory Visit | Attending: Family Medicine | Admitting: Family Medicine

## 2020-07-02 VITALS — BP 98/56 | HR 95 | Ht 61.0 in | Wt 114.2 lb

## 2020-07-02 DIAGNOSIS — B9689 Other specified bacterial agents as the cause of diseases classified elsewhere: Secondary | ICD-10-CM | POA: Insufficient documentation

## 2020-07-02 DIAGNOSIS — N760 Acute vaginitis: Secondary | ICD-10-CM | POA: Diagnosis not present

## 2020-07-02 MED ORDER — METRONIDAZOLE 1 % EX GEL: Freq: Every day | CUTANEOUS | 0 refills | Status: AC

## 2020-07-02 MED ORDER — FLUCONAZOLE 150 MG PO TABS: 150.0000 mg | ORAL_TABLET | Freq: Once | ORAL | 0 refills | Status: AC

## 2020-07-02 NOTE — Patient Instructions (Addendum)
Thank you for coming to see me today. It was a pleasure. Today we discussed your recurrent BV infections.  I am sorry that these are troubling you.  I recommend 10 days of MetroGel in the vagina followed by twice weekly applications of MetroGel for the next few months.  It is normal to get a yeast infection after being treated for BV.  I am also sending in Diflucan you can take this if you get the symptoms of a yeast infection.  You can also try boric acid but there is no enough data to support using this.  Please use this in caution and do not ingest this as it can cause death.  You can buy it of [employer].  Would also recommend considering a better form of birth control such as IUD, Nexplanon, Depo etc which do a better job of preventing pregnancies.  Please follow-up with your PCP  If you have any questions or concerns, please do not hesitate to call the office at [phone].  Best wishes,   Dr Allena Katz

## 2020-07-02 NOTE — Assessment & Plan Note (Signed)
Wet prep pending, likely BV.  Patient has had multiple episodes of BV triggered by menses.  Explained that the menstrual cycle can change vaginal pH levels which can then cause BV.  I also explained that treating the BV can cause yeast infections because of the pH changes in the vagina.  Patient is very upset with her recurrent episodes.  Will prescribe treatment for recurrent BV: 10 days of MetroGel followed by twice weekly MetroGel for the next 2 months.  Also prescribed Diflucan in case she gets yeast infections.  Patient would also like to try boric acid for recurrent BV.  I explained that there is insufficient data to support the use of this but she could try this from [place] if she would like.Marland Kitchen  Recommend against ingestion of boric acid as this can cause death.  Recommend follow-up with PCP.

## 2020-07-03 LAB — CERVICOVAGINAL ANCILLARY ONLY
Bacterial Vaginitis (gardnerella): POSITIVE — AB
Candida Glabrata: NEGATIVE
Candida Vaginitis: NEGATIVE
Chlamydia: NEGATIVE
Comment: NEGATIVE
Comment: NEGATIVE
Comment: NEGATIVE
Comment: NEGATIVE
Comment: NEGATIVE
Comment: NORMAL
Neisseria Gonorrhea: NEGATIVE
Trichomonas: NEGATIVE

## 2020-08-03 ENCOUNTER — Emergency Department (HOSPITAL_COMMUNITY)
Admission: EM | Admit: 2020-08-03 | Discharge: 2020-08-03 | Disposition: A | Discharge status: Home / Self Care | Payer: Medicaid Other | Attending: Emergency Medicine | Admitting: Emergency Medicine

## 2020-08-03 ENCOUNTER — Emergency Department (HOSPITAL_COMMUNITY): Payer: Medicaid Other

## 2020-08-03 DIAGNOSIS — R0789 Other chest pain: Secondary | ICD-10-CM | POA: Insufficient documentation

## 2020-08-03 DIAGNOSIS — S161XXA Strain of muscle, fascia and tendon at neck level, initial encounter: Secondary | ICD-10-CM | POA: Diagnosis not present

## 2020-08-03 DIAGNOSIS — S0990XA Unspecified injury of head, initial encounter: Secondary | ICD-10-CM | POA: Diagnosis not present

## 2020-08-03 DIAGNOSIS — S199XXA Unspecified injury of neck, initial encounter: Secondary | ICD-10-CM | POA: Diagnosis present

## 2020-08-03 DIAGNOSIS — Y92481 Parking lot as the place of occurrence of the external cause: Secondary | ICD-10-CM | POA: Diagnosis not present

## 2020-08-03 MED ORDER — IBUPROFEN 400 MG PO TABS
600.0000 mg | ORAL_TABLET | Freq: Once | ORAL | Status: AC
  Administered 2020-08-03: 600 mg via ORAL
  Filled 2020-08-03: qty 1
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
## 2020-08-03 NOTE — ED Notes (Signed)
Patient transported to X-ray 

## 2020-08-03 NOTE — Discharge Instructions (Addendum)
Follow-up with your primary care doctor if your symptoms are improving.  Return here as needed for any worsening symptoms.
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
## 2020-08-04 ENCOUNTER — Telehealth: Payer: Self-pay | Admitting: *Deleted

## 2020-08-04 ENCOUNTER — Encounter (HOSPITAL_COMMUNITY): Payer: Self-pay

## 2020-08-04 ENCOUNTER — Ambulatory Visit (HOSPITAL_COMMUNITY)
Admission: EM | Admit: 2020-08-04 | Discharge: 2020-08-04 | Disposition: A | Discharge status: Home / Self Care | Payer: Medicaid Other | Attending: Emergency Medicine | Admitting: Emergency Medicine

## 2020-08-04 ENCOUNTER — Other Ambulatory Visit: Payer: Self-pay

## 2020-08-04 DIAGNOSIS — J029 Acute pharyngitis, unspecified: Secondary | ICD-10-CM | POA: Insufficient documentation

## 2020-08-04 DIAGNOSIS — Z888 Allergy status to other drugs, medicaments and biological substances status: Secondary | ICD-10-CM | POA: Insufficient documentation

## 2020-08-04 DIAGNOSIS — Z20822 Contact with and (suspected) exposure to covid-19: Secondary | ICD-10-CM | POA: Insufficient documentation

## 2020-08-04 DIAGNOSIS — Z880 Allergy status to penicillin: Secondary | ICD-10-CM | POA: Insufficient documentation

## 2020-08-04 DIAGNOSIS — J302 Other seasonal allergic rhinitis: Secondary | ICD-10-CM | POA: Insufficient documentation

## 2020-08-04 DIAGNOSIS — Z885 Allergy status to narcotic agent status: Secondary | ICD-10-CM | POA: Insufficient documentation

## 2020-08-04 LAB — POCT RAPID STREP A, ED / UC: Streptococcus, Group A Screen (Direct): NEGATIVE

## 2020-08-04 LAB — POC INFLUENZA A AND B ANTIGEN (URGENT CARE ONLY)
Influenza A Ag: NEGATIVE
Influenza B Ag: NEGATIVE

## 2020-08-04 LAB — SARS CORONAVIRUS 2 (TAT 6-24 HRS): SARS Coronavirus 2: NEGATIVE
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
## 2020-08-04 NOTE — ED Triage Notes (Signed)
Pt presents with sore throat X 3 days.  

## 2020-08-04 NOTE — Telephone Encounter (Signed)
Transition Care Management Unsuccessful Follow-up Telephone Call  Date of discharge and from where:  08/03/2020 [facility]  Attempts:  1st Attempt  Reason for unsuccessful TCM follow-up call:  Left voice message

## 2020-08-04 NOTE — Discharge Instructions (Addendum)
Can continue warm salt water gargles, use throat lozenges, chloraseptic over the counter spray  If tonsil stones bothering you, can use toothbrush over area, do not dig them out, it will cause more irritation, they fall out naturally
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
## 2020-08-06 LAB — CULTURE, GROUP A STREP (THRC)

## 2020-08-11 ENCOUNTER — Ambulatory Visit (HOSPITAL_COMMUNITY)
Admission: EM | Admit: 2020-08-11 | Discharge: 2020-08-11 | Disposition: A | Discharge status: Home / Self Care | Payer: Medicaid Other

## 2020-08-11 ENCOUNTER — Emergency Department (HOSPITAL_COMMUNITY): Payer: No Typology Code available for payment source

## 2020-08-11 ENCOUNTER — Emergency Department (HOSPITAL_COMMUNITY)
Admission: EM | Admit: 2020-08-11 | Discharge: 2020-08-11 | Discharge status: Against Medical Advice | Payer: No Typology Code available for payment source | Attending: Emergency Medicine | Admitting: Emergency Medicine

## 2020-08-11 ENCOUNTER — Encounter (HOSPITAL_COMMUNITY): Payer: Self-pay

## 2020-08-11 ENCOUNTER — Other Ambulatory Visit: Payer: Self-pay

## 2020-08-11 DIAGNOSIS — M542 Cervicalgia: Secondary | ICD-10-CM

## 2020-08-11 DIAGNOSIS — M546 Pain in thoracic spine: Secondary | ICD-10-CM | POA: Diagnosis not present

## 2020-08-11 DIAGNOSIS — Y9389 Activity, other specified: Secondary | ICD-10-CM | POA: Diagnosis not present

## 2020-08-11 DIAGNOSIS — Y92410 Unspecified street and highway as the place of occurrence of the external cause: Secondary | ICD-10-CM | POA: Insufficient documentation

## 2020-08-11 DIAGNOSIS — M549 Dorsalgia, unspecified: Secondary | ICD-10-CM

## 2020-08-11 DIAGNOSIS — M545 Low back pain, unspecified: Secondary | ICD-10-CM | POA: Insufficient documentation

## 2020-08-11 DIAGNOSIS — R42 Dizziness and giddiness: Secondary | ICD-10-CM

## 2020-08-11 DIAGNOSIS — R519 Headache, unspecified: Secondary | ICD-10-CM | POA: Diagnosis not present

## 2020-08-11 DIAGNOSIS — Y999 Unspecified external cause status: Secondary | ICD-10-CM | POA: Insufficient documentation

## 2020-08-11 LAB — POC URINE PREG, ED: Preg Test, Ur: NEGATIVE
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
## 2020-08-11 NOTE — Discharge Instructions (Addendum)
You are leaving AGAINST MEDICAL ADVICE, please feel free to come back to the emergency department if you have any new or worsening concerning symptoms.  As we discussed you most likely have a concussion, however it was recommended for you to have a CT head and neck because of your symptoms.
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
## 2020-08-11 NOTE — ED Notes (Signed)
Patient verbalizes understanding of discharge instructions. Opportunity for questioning and answers were provided. Armband removed by staff, pt discharged from ED AMA.

## 2020-08-11 NOTE — ED Notes (Signed)
Called x 1 NO answer 

## 2020-08-12 ENCOUNTER — Ambulatory Visit (HOSPITAL_COMMUNITY)
Admission: EM | Admit: 2020-08-12 | Discharge: 2020-08-12 | Disposition: A | Discharge status: Home / Self Care | Payer: Medicaid Other | Attending: Family Medicine | Admitting: Family Medicine

## 2020-08-12 ENCOUNTER — Encounter (HOSPITAL_COMMUNITY): Payer: Self-pay

## 2020-08-12 ENCOUNTER — Telehealth: Payer: Self-pay

## 2020-08-12 ENCOUNTER — Other Ambulatory Visit: Payer: Self-pay

## 2020-08-12 DIAGNOSIS — B9689 Other specified bacterial agents as the cause of diseases classified elsewhere: Secondary | ICD-10-CM

## 2020-08-12 DIAGNOSIS — N898 Other specified noninflammatory disorders of vagina: Secondary | ICD-10-CM | POA: Insufficient documentation

## 2020-08-12 DIAGNOSIS — N760 Acute vaginitis: Secondary | ICD-10-CM

## 2020-08-12 MED ORDER — CLINDAMYCIN HCL 300 MG PO CAPS: 300.0000 mg | ORAL_CAPSULE | Freq: Two times a day (BID) | ORAL | 0 refills | Status: DC

## 2020-08-12 NOTE — Discharge Instructions (Signed)
We have sent testing for vaginal infections. We will notify you of any positive results once they are received. If required, we will prescribe any medications you might need.  Please refrain from all sexual activity for at least the next seven days.  

## 2020-08-12 NOTE — ED Provider Notes (Signed)
[facility]   [id number] 08/12/20 Arrival Time: 1725  ASSESSMENT & PLAN:  1. Vaginal discharge    Begin: Meds ordered this encounter  Medications  . clindamycin (CLEOCIN) 300 MG capsule    Sig: Take 1 capsule (300 mg total) by mouth 2 (two) times daily.    Dispense:  14 capsule    Refill:  0   Vaginal cytology pending.   Discharge Instructions     We have sent testing for vaginal infections. We will notify you of any positive results once they are received. If required, we will prescribe any medications you might need.  Please refrain from all sexual activity for at least the next seven days.    Without s/s of PID.  Reviewed expectations re: course of current medical issues. Questions answered. Outlined signs and symptoms indicating need for more acute intervention. Patient verbalized understanding. After Visit Summary given.   SUBJECTIVE:  [name] is a 20 y.o. female who reports h/o recurrent BV. Has been treated a few times with metronidazole gel; "doesn't seem to help". Reports current clear vaginal discharge with fishy odor. Afebrile. No abd/pelvic pain. Not worried re: STDs. No OTC tx.  Patient's last menstrual period was 07/25/2020 (approximate).   OBJECTIVE:  Vitals:   08/12/20 1833  BP: 96/66  Pulse: 80  Resp: 15  Temp: 99 F (37.2 C)  TempSrc: Oral  SpO2: 99%     General appearance: alert, cooperative, appears stated age and no distress Lungs: unlabored respirations; speaks full sentences without difficulty Back: no CVA tenderness; FROM at waist Abdomen: soft, non-tender GU: deferred Skin: warm and dry Psychological: alert and cooperative; normal mood and affect.  Results for orders placed or performed during the hospital encounter of 08/11/20  POC urine preg, ED  Result Value Ref Range   Preg Test, Ur NEGATIVE NEGATIVE    Labs Reviewed  CERVICOVAGINAL ANCILLARY ONLY    Allergies  Allergen Reactions  . Penicillins  Rash  . Amoxicillin   . Tramadol     Past Medical History:  Diagnosis Date  . Screen for STD (sexually transmitted disease) 02/28/2017  . Seasonal allergies   . Vaginal bleeding 09/17/2016  . Vulvovaginal candidiasis 11/15/2014   Family History  Problem Relation Age of Onset  . Hypertension Mother   . Asthma Sister   . Hypertension Maternal Grandmother   . Arthritis Maternal Grandmother   . Arthritis Maternal Grandfather    Social History   Socioeconomic History  . Marital status: Single    Spouse name: Not on file  . Number of children: Not on file  . Years of education: Not on file  . Highest education level: Not on file  Occupational History  . Not on file  Tobacco Use  . Smoking status: Never Smoker  . Smokeless tobacco: Never Used  Vaping Use  . Vaping Use: Never used  Substance and Sexual Activity  . Alcohol use: No  . Drug use: No  . Sexual activity: Never    Birth control/protection: Abstinence  Other Topics Concern  . Not on file  Social History Narrative  . Not on file   Social Determinants of Health   Financial Resource Strain: Not on file  Food Insecurity: Not on file  Transportation Needs: Not on file  Physical Activity: Not on file  Stress: Not on file  Social Connections: Not on file  Intimate Partner Violence: Not on file          Mardella Layman, MD  08/12/20 1907  

## 2020-08-12 NOTE — ED Triage Notes (Signed)
Pt presents with a recurring BV. She states she has a foul odor in vaginal area. She states she has vaginal discharge. Pt states she has been given the same medication before and it has not helped. She states she has already followed up with [facility].

## 2020-08-12 NOTE — Telephone Encounter (Signed)
Patient calls nurse line requesting oral medication to treat BV. Patient has been seen in January and February for recurrent BV. Patient states that she does not believe the gel is working and would like to try something different.   Patient is currently reporting "fishy odor" with increased vaginal discharge.   Patient is requesting medication to be sent to [facility] on [address]. Offered to schedule patient appointment, however, we do not have any openings until Monday. Patient would like to be treated before then.   Please advise.   Veronda Prude, RN

## 2020-08-13 LAB — CERVICOVAGINAL ANCILLARY ONLY
Bacterial Vaginitis (gardnerella): POSITIVE — AB
Candida Glabrata: NEGATIVE
Candida Vaginitis: NEGATIVE
Chlamydia: NEGATIVE
Comment: NEGATIVE
Comment: NEGATIVE
Comment: NEGATIVE
Comment: NEGATIVE
Comment: NEGATIVE
Comment: NORMAL
Neisseria Gonorrhea: NEGATIVE
Trichomonas: NEGATIVE

## 2020-08-18 NOTE — Addendum Note (Signed)
Addended by: Latrelle Dodrill on: 08/18/2020 05:27 PM   Modules accepted: Orders

## 2020-08-18 NOTE — Telephone Encounter (Signed)
Returned call to patient She went to UC and was treated with clindamycin She has many questions about recurrent BV Advised she could try vaginal boric acid suppositories, counseled on never consuming these orally She is hesitant to try this and wants to know what other treatment options exist Advised I can refer her to a gynecologist if she'd like, she does request GYN ref. Will place referral  Latrelle Dodrill, MD

## 2020-09-01 ENCOUNTER — Ambulatory Visit: Payer: Medicaid Other | Admitting: Family Medicine

## 2020-09-04 ENCOUNTER — Ambulatory Visit (INDEPENDENT_AMBULATORY_CARE_PROVIDER_SITE_OTHER): Payer: Self-pay | Admitting: Family Medicine

## 2020-09-04 ENCOUNTER — Encounter: Payer: Self-pay | Admitting: Family Medicine

## 2020-09-04 ENCOUNTER — Other Ambulatory Visit: Payer: Self-pay

## 2020-09-04 VITALS — BP 104/62 | HR 80 | Wt 119.2 lb

## 2020-09-04 DIAGNOSIS — M549 Dorsalgia, unspecified: Secondary | ICD-10-CM

## 2020-09-04 MED ORDER — CYCLOBENZAPRINE HCL 10 MG PO TABS: 10.0000 mg | ORAL_TABLET | Freq: Two times a day (BID) | ORAL | 0 refills | Status: DC | PRN

## 2020-09-04 MED ORDER — IBUPROFEN 800 MG PO TABS: 800.0000 mg | ORAL_TABLET | Freq: Three times a day (TID) | ORAL | 0 refills | Status: DC | PRN
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
## 2020-09-04 NOTE — Patient Instructions (Addendum)
I'm sorry you aren't feeling well.  Sent in two medications for you: - ibuprofen - higher strength, can take 1 pill every 8 hours as needed. Take with food to help your stomach tolerate it better - flexeril - muscle relaxer. Start with half a pill at a time and go up to full pill if needed. Can take it 2 times a day as needed.  Keep appointment with chiropractor Let me know if you decide you want to get imaging of your head or back.  Do gentle stretches  Be well, Dr. Pollie Meyer    Cervical Sprain A cervical sprain is a stretch or tear in one or more of the ligaments in the neck. Ligaments are the tissues that connect bones. Cervical sprains can range from mild to severe. Severe cervical sprains can cause the spinal bones (vertebrae) in the neck to be unstable. This can result in spinal cord damage and in serious nervous system problems. The time that it takes for a cervical sprain to heal depends on the cause and extent of the injury. Most cervical sprains heal in 4-6 weeks. What are the causes? Cervical sprains may be caused by trauma, such as an injury from a motor vehicle accident, a fall, or a sudden forward and backward whipping movement of the head and neck (whiplash injury). Mild cervical sprains may be caused by wear and tear over time. What increases the risk? The following factors may make you more likely to develop this condition:  Participating in activities that have a high risk of trauma to the neck. These include contact sports, auto racing, gymnastics, and diving.  Taking risks when driving or riding in a motor vehicle.  Osteoarthritis of the spine.  Poor strength and flexibility of the neck.  A previous neck injury.  Poor posture.  Spending long periods in certain positions that put stress on the neck, such as sitting at a computer for a long time. What are the signs or symptoms? Symptoms of this condition include:  Pain, soreness, stiffness, tenderness,  swelling, or a burning sensation in the front, back, or sides of the neck, shoulders, or upper back.  Sudden tightening of neck muscles (spasms).  Limited ability to move the neck.  Headache.  Dizziness.  Nausea or vomiting.  Weakness, numbness, or tingling in a hand or an arm. Symptoms may develop right away after injury, or they may develop over a few days. In some cases, symptoms may go away with treatment and return (recur) over time. How is this diagnosed? This condition may be diagnosed based on:  Your medical history.  Your symptoms.  Any recent injuries or known neck problems that you have, such as arthritis in the neck.  A physical exam.  Imaging tests, such as X-rays, MRI, and CT scan. How is this treated? This condition is treated by resting and icing the injured area and doing physical therapy exercises. Heat therapy may be used 2-3 days after the injury occurred if there is no swelling. Depending on the severity of your condition, treatment may also include:  Keeping your neck in place (immobilized) for periods of time. This may be done using: ? A cervical collar. This supports your chin and the back of your head. ? A cervical traction device. This is a sling that holds up your head. The device removes weight and pressure from your neck, and it may help to relieve pain.  Medicines that help to relieve pain and inflammation.  Medicines that help to  relax your muscles (muscle relaxants).  Surgery. This is rare. Follow these instructions at home: Medicines  Take over-the-counter and prescription medicines only as told by your health care provider.  Ask your health care provider if the medicine prescribed to you: ? Requires you to avoid driving or using heavy machinery. ? Can cause constipation. You may need to take these actions to prevent or treat constipation:  Drink enough fluid to keep your urine pale yellow.  Take over-the-counter or prescription  medicines.  Eat foods that are high in fiber, such as beans, whole grains, and fresh fruits and vegetables.  Limit foods that are high in fat and processed sugars, such as fried or sweet foods.   If you have a cervical collar:  Wear the collar as told by your health care provider. Do not remove it unless told.  Ask before making any adjustments to your collar.  If you have long hair, keep it outside of the collar.  Ask your health care provider if you may remove the collar for cleaning and bathing. If so: ? Follow instructions about how to remove it safely. ? Clean it by hand with mild soap and water and air-dry it completely. ? If your collar has removable pads, remove them every 1-2 days and wash them by hand with soap and water. Let them air-dry completely before putting them back in the collar.  Tell your health care provider if your skin under the collar has irritation or sores. Managing pain, stiffness, and swelling  If directed, use a cervical traction device as told.  If directed, put ice on the affected area. To do this: ? Put ice in a plastic bag. ? Place a towel between your skin and the bag. ? Leave the ice on for 20 minutes, 2-3 times a day.  If directed, apply heat to the affected area before you do your physical therapy or as often as told by your health care provider. Use the heat source that your health care provider recommends, such as a moist heat pack or a heating pad. ? Place a towel between your skin and the heat source. ? Leave the heat on for 20-30 minutes. ? Remove the heat if your skin turns bright red. This is especially important if you are unable to feel pain, heat, or cold. You may have a greater risk of getting burned.      Activity  Do not drive while wearing a cervical collar. If you do not have a cervical collar, ask if it is safe to drive while your neck heals.  Do not lift anything that is heavier than 10 lb (4.5 kg), or the limit that you are  told, until your health care provider says that it is safe.  Rest as told by your health care provider.  If physical therapy was prescribed, do exercises as told by your health care provider or physical therapist.  Return to your normal activities as told by your health care provider. Avoid positions and activities that make your symptoms worse. Ask your health care provider what activities are safe for you. General instructions  Do not use any products that contain nicotine or tobacco, such as cigarettes, e-cigarettes, and chewing tobacco. These can delay healing. If you need help quitting, ask your health care provider.  Keep all follow-up visits as told by your health care provider or physical therapist. This is important. How is this prevented? To prevent a cervical sprain from happening again:  Use and  maintain good posture. Make any needed adjustments to your workstation to help you do this.  Exercise regularly as told by your health care provider or physical therapist.  Avoid risky activities that may cause a cervical sprain. Contact a health care provider if you have:  Symptoms that get worse or do not get better after 2 weeks of treatment.  Pain that gets worse or does not get better with medicine.  New, unexplained symptoms.  Sores or irritated skin on your neck from wearing your cervical collar. Get help right away if:  You have severe pain.  You develop numbness, tingling, or weakness in any part of your body.  You cannot move a part of your body (you have paralysis).  You have neck pain along with severe dizziness or headache. Summary  A cervical sprain is a stretch or tear in one or more of the ligaments in the neck.  Cervical sprains may be caused by trauma, such as an injury from a motor vehicle accident, a fall, or a sudden forward and backward whipping movement of the head and neck (whiplash injury).  Symptoms may develop right away after injury, or they  may develop over a few days.  This condition may be treated with rest, ice, heat, medicines, physical therapy, and surgery. This information is not intended to replace advice given to you by your health care provider. Make sure you discuss any questions you have with your health care provider. Document Revised: 01/03/2019 Document Reviewed: 01/03/2019 Elsevier Patient Education  2021 [employer].

## 2020-09-11 ENCOUNTER — Ambulatory Visit: Payer: Medicaid Other | Admitting: Obstetrics

## 2020-10-05 ENCOUNTER — Encounter (HOSPITAL_COMMUNITY): Payer: Self-pay

## 2020-10-05 ENCOUNTER — Ambulatory Visit (HOSPITAL_COMMUNITY)
Admission: EM | Admit: 2020-10-05 | Discharge: 2020-10-05 | Disposition: A | Discharge status: Home / Self Care | Payer: Medicaid Other | Attending: Student | Admitting: Student

## 2020-10-05 DIAGNOSIS — N760 Acute vaginitis: Secondary | ICD-10-CM | POA: Insufficient documentation

## 2020-10-05 DIAGNOSIS — Z113 Encounter for screening for infections with a predominantly sexual mode of transmission: Secondary | ICD-10-CM | POA: Insufficient documentation

## 2020-10-05 DIAGNOSIS — B9689 Other specified bacterial agents as the cause of diseases classified elsewhere: Secondary | ICD-10-CM

## 2020-10-05 MED ORDER — CLINDAMYCIN HCL 300 MG PO CAPS: 300.0000 mg | ORAL_CAPSULE | Freq: Two times a day (BID) | ORAL | 0 refills | Status: AC

## 2020-10-05 NOTE — ED Provider Notes (Signed)
[facility]    CSN: [id number] Arrival date & time: 10/05/20  1004      History   Chief Complaint Chief Complaint  Patient presents with  . vaginal irritation  . Vaginal Discharge    HPI [name] is a 20 y.o. female presenting for STI screen.  Medical history vaginal candidiasis, BV.  Was last seen at our urgent care on 4/5 for vaginal discharge, was treated for BV with clindamycin 300mg  bid x7 days.  States she is again having 3 days of white/tan thin vaginal discharge with some external vaginal itching and irritation.  Occasional lower crampy abdominal pain.  She does have 1 new female partner. Denies hematuria, dysuria, frequency, urgency, back pain, n/v/d, fevers/chills, abdnormal vaginal lesions or rashes. LMP 2 weeks ago, states she could not be pregnant.    HPI  Past Medical History:  Diagnosis Date  . Screen for STD (sexually transmitted disease) 02/28/2017  . Seasonal allergies   . Vaginal bleeding 09/17/2016  . Vulvovaginal candidiasis 11/15/2014    Patient Active Problem List   Diagnosis Date Noted  . Yeast infection 01/01/2020  . Sore throat 12/04/2019  . BV (bacterial vaginosis) 05/18/2018  . Contraception management 11/28/2017  . Vaginal discharge 09/30/2017  . Screen for STD (sexually transmitted disease) 02/28/2017    Past Surgical History:  Procedure Laterality Date  . LAPAROSCOPIC APPENDECTOMY N/A 11/08/2014   Procedure: APPENDECTOMY LAPAROSCOPIC;  Surgeon: 01/09/2015, MD;  Location: [facility] OR;  Service: Pediatrics;  Laterality: N/A;    OB History   No obstetric history on file.      Home Medications    Prior to Admission medications   Medication Sig Start Date End Date Taking? Authorizing Provider  cetirizine (ZYRTEC) 10 MG tablet Take 1 tablet (10 mg total) by mouth daily as needed for allergies. 12/21/18  Yes 12/23/18, MD  clindamycin (CLEOCIN) 300 MG capsule Take 1 capsule (300 mg total) by mouth in the morning and at  bedtime for 7 days. 10/05/20 10/12/20 Yes 12/12/20, PA-C  ibuprofen (ADVIL) 800 MG tablet Take 1 tablet (800 mg total) by mouth every 8 (eight) hours as needed. 09/04/20  Yes 09/06/20, MD  ipratropium (ATROVENT) 0.06 % nasal spray Place 2 sprays into both nostrils 4 (four) times daily. 07/12/18  Yes Yu, Amy V, PA-C  norelgestromin-ethinyl estradiol (ORTHO EVRA) 150-35 MCG/24HR transdermal patch Place 1 patch onto the skin once a week. 12/18/18 07/25/19  07/27/19, DO    Family History Family History  Problem Relation Age of Onset  . Hypertension Mother   . Heart disease Mother   . Asthma Sister   . Hypertension Maternal Grandmother   . Arthritis Maternal Grandmother   . Cancer Maternal Grandmother   . Arthritis Maternal Grandfather     Social History Social History   Tobacco Use  . Smoking status: Never Smoker  . Smokeless tobacco: Never Used  Vaping Use  . Vaping Use: Never used  Substance Use Topics  . Alcohol use: No  . Drug use: No     Allergies   Penicillins, Amoxicillin, and Tramadol   Review of Systems Review of Systems  Constitutional: Negative for appetite change, chills, diaphoresis and fever.  Respiratory: Negative for shortness of breath.   Cardiovascular: Negative for chest pain.  Gastrointestinal: Negative for abdominal pain, blood in stool, constipation, diarrhea, nausea and vomiting.  Genitourinary: Positive for vaginal discharge. Negative for decreased urine volume, difficulty urinating, dysuria, flank  pain, frequency, genital sores, hematuria, menstrual problem, pelvic pain, urgency, vaginal bleeding and vaginal pain.  Musculoskeletal: Negative for back pain.  Neurological: Negative for dizziness, weakness and light-headedness.  All other systems reviewed and are negative.    Physical Exam Triage Vital Signs ED Triage Vitals  Enc Vitals Group     BP      Pulse      Resp      Temp      Temp src      SpO2      Weight       Height      Head Circumference      Peak Flow      Pain Score      Pain Loc      Pain Edu?      Excl. in GC?    No data found.  Updated Vital Signs BP 103/67   Pulse 72   Temp 98.4 F (36.9 C)   Resp 18   LMP 09/18/2020 (Approximate)   SpO2 100%   Visual Acuity Right Eye Distance:   Left Eye Distance:   Bilateral Distance:    Right Eye Near:   Left Eye Near:    Bilateral Near:     Physical Exam Vitals reviewed.  Constitutional:      General: She is not in acute distress.    Appearance: Normal appearance. She is not ill-appearing.  HENT:     Head: Normocephalic and atraumatic.     Mouth/Throat:     Mouth: Mucous membranes are moist.     Comments: Moist mucous membranes Eyes:     Extraocular Movements: Extraocular movements intact.     Pupils: Pupils are equal, round, and reactive to light.  Cardiovascular:     Rate and Rhythm: Normal rate and regular rhythm.     Heart sounds: Normal heart sounds.  Pulmonary:     Effort: Pulmonary effort is normal.     Breath sounds: Normal breath sounds. No wheezing, rhonchi or rales.  Abdominal:     General: Bowel sounds are normal. There is no distension.     Palpations: Abdomen is soft. There is no mass.     Tenderness: There is no abdominal tenderness. There is no right CVA tenderness, left CVA tenderness, guarding or rebound.     Comments: No abd pain or CVAT  Genitourinary:    Comments: deferred Skin:    General: Skin is warm.     Capillary Refill: Capillary refill takes less than 2 seconds.     Comments: Good skin turgor  Neurological:     General: No focal deficit present.     Mental Status: She is alert and oriented to person, place, and time.  Psychiatric:        Mood and Affect: Mood normal.        Behavior: Behavior normal.        Thought Content: Thought content normal.        Judgment: Judgment normal.      UC Treatments / Results  Labs (all labs ordered are listed, but only abnormal results are  displayed) Labs Reviewed  CERVICOVAGINAL ANCILLARY ONLY    EKG   Radiology No results found.  Procedures Procedures (including critical care time)  Medications Ordered in UC Medications - No data to display  Initial Impression / Assessment and Plan / UC Course  I have reviewed the triage vital signs and the nursing notes.  Pertinent labs & imaging results  that were available during my care of the patient were reviewed by me and considered in my medical decision making (see chart for details).     This patient is a 20 year old female presenting with suspected BV.  She is afebrile, nontachycardic, no abdominal pain or CVAT on exam.  Long history recurrent BV. She does have 1 new female partner.  Self swab sent for BV, yeast, gonorrhea, chlamydia, trichomonas.  Declines HIV, syphilis.  Safe sex precautions.  Discussed risks and benefits of waiting to treat vs treating today based on symptoms. Patient states she prefers to be treated for BV today. States the only medication that works for her is the clindamycin 300mg  bid x7 days. Sent as below. States she could not be pregnant.   Coding this visit a level 4 for review of prior external note (08/12/20), review of results of each test (08/12/20), ordering of each unique test (STI swab), prescription drug management.  Final Clinical Impressions(s) / UC Diagnoses   Final diagnoses:  BV (bacterial vaginosis)  Routine screening for STI (sexually transmitted infection)     Discharge Instructions     -We are treating you for BV today with clindamycin, twice daily for 7 days.  You can take this antibiotic with food if you have a sensitive stomach. -We are testing for BV, yeast, gonorrhea, chlamydia, trichomonas.  We will call you if anything else is positive and can send additional treatment at that time.  Try to abstain from sex until you have negative test results. -Seek additional immediate medical attention if you develop new symptoms  like new/worsening abdominal pain, back pain, fever/chills, vaginal discharge, etc.    ED Prescriptions    Medication Sig Dispense Auth. Provider   clindamycin (CLEOCIN) 300 MG capsule Take 1 capsule (300 mg total) by mouth in the morning and at bedtime for 7 days. 14 capsule 10/12/20, PA-C     PDMP not reviewed this encounter.   Rhys Martini, PA-C 10/05/20 1132

## 2020-10-05 NOTE — ED Triage Notes (Signed)
Pt reports thinks she has BV. Reports vaginal irritation/whitish tan discharge for 2-3 days.

## 2020-10-05 NOTE — Discharge Instructions (Signed)
-  We are treating you for BV today with clindamycin, twice daily for 7 days.  You can take this antibiotic with food if you have a sensitive stomach. -We are testing for BV, yeast, gonorrhea, chlamydia, trichomonas.  We will call you if anything else is positive and can send additional treatment at that time.  Try to abstain from sex until you have negative test results. -Seek additional immediate medical attention if you develop new symptoms like new/worsening abdominal pain, back pain, fever/chills, vaginal discharge, etc.

## 2020-10-07 LAB — CERVICOVAGINAL ANCILLARY ONLY
Bacterial Vaginitis (gardnerella): NEGATIVE
Candida Glabrata: NEGATIVE
Candida Vaginitis: POSITIVE — AB
Chlamydia: NEGATIVE
Comment: NEGATIVE
Comment: NEGATIVE
Comment: NEGATIVE
Comment: NEGATIVE
Comment: NEGATIVE
Comment: NORMAL
Neisseria Gonorrhea: NEGATIVE
Trichomonas: NEGATIVE

## 2020-10-08 ENCOUNTER — Telehealth (HOSPITAL_COMMUNITY): Payer: Self-pay | Admitting: Emergency Medicine

## 2020-10-08 MED ORDER — FLUCONAZOLE 150 MG PO TABS: 150.0000 mg | ORAL_TABLET | Freq: Once | ORAL | 0 refills | Status: AC

## 2020-10-31 ENCOUNTER — Encounter (HOSPITAL_COMMUNITY): Payer: Self-pay

## 2020-10-31 ENCOUNTER — Ambulatory Visit (HOSPITAL_COMMUNITY)
Admission: EM | Admit: 2020-10-31 | Discharge: 2020-10-31 | Disposition: A | Discharge status: Home / Self Care | Payer: Self-pay | Attending: Medical Oncology | Admitting: Medical Oncology

## 2020-10-31 DIAGNOSIS — N939 Abnormal uterine and vaginal bleeding, unspecified: Secondary | ICD-10-CM | POA: Insufficient documentation

## 2020-10-31 LAB — POCT URINALYSIS DIPSTICK, ED / UC
Bilirubin Urine: NEGATIVE
Glucose, UA: NEGATIVE mg/dL
Leukocytes,Ua: NEGATIVE
Nitrite: NEGATIVE
Protein, ur: NEGATIVE mg/dL
Specific Gravity, Urine: >=1.03 (ref 1.005–1.030)
Urobilinogen, UA: 0.2 mg/dL (ref 0.0–1.0)
pH: 5 (ref 5.0–8.0)

## 2020-10-31 LAB — POC URINE PREG, ED: Preg Test, Ur: NEGATIVE

## 2020-10-31 NOTE — ED Provider Notes (Signed)
[facility]    CSN: [id number] Arrival date & time: 10/31/20  1154      History   Chief Complaint Chief Complaint  Patient presents with   Vaginal Bleeding   Abdominal Pain    HPI [name] is a 20 y.o. female.   HPI  Vaginal bleeding: Patient states that she has had vaginal bleeding as of today along with some cramping.  She states that her last menstrual cycle was on May 8.  She states that her cycles are normally irregular so this was not of concern to her however she states that she has had unprotected sexual intercourse and wanted to make sure that her urine pregnancy test is negative.  She denies any significant vaginal discharge.  She denies any nausea, vomiting or diarrhea.  She has not tried anything for symptoms.  Past Medical History:  Diagnosis Date   Screen for STD (sexually transmitted disease) 02/28/2017   Seasonal allergies    Vaginal bleeding 09/17/2016   Vulvovaginal candidiasis 11/15/2014    Patient Active Problem List   Diagnosis Date Noted   Yeast infection 01/01/2020   Sore throat 12/04/2019   BV (bacterial vaginosis) 05/18/2018   Contraception management 11/28/2017   Vaginal discharge 09/30/2017   Screen for STD (sexually transmitted disease) 02/28/2017    Past Surgical History:  Procedure Laterality Date   LAPAROSCOPIC APPENDECTOMY N/A 11/08/2014   Procedure: APPENDECTOMY LAPAROSCOPIC;  Surgeon: Leonia Corona, MD;  Location: [facility];  Service: Pediatrics;  Laterality: N/A;    OB History   No obstetric history on file.      Home Medications    Prior to Admission medications   Medication Sig Start Date End Date Taking? Authorizing Provider  cetirizine (ZYRTEC) 10 MG tablet Take 1 tablet (10 mg total) by mouth daily as needed for allergies. 12/21/18   Latrelle Dodrill, MD  ibuprofen (ADVIL) 800 MG tablet Take 1 tablet (800 mg total) by mouth every 8 (eight) hours as needed. 09/04/20   Latrelle Dodrill, MD  ipratropium  (ATROVENT) 0.06 % nasal spray Place 2 sprays into both nostrils 4 (four) times daily. 07/12/18   Cathie Hoops, Amy V, PA-C  norelgestromin-ethinyl estradiol (ORTHO EVRA) 150-35 MCG/24HR transdermal patch Place 1 patch onto the skin once a week. 12/18/18 07/25/19  Joana Reamer, DO    Family History Family History  Problem Relation Age of Onset   Hypertension Mother    Heart disease Mother    Asthma Sister    Hypertension Maternal Grandmother    Arthritis Maternal Grandmother    Cancer Maternal Grandmother    Arthritis Maternal Grandfather     Social History Social History   Tobacco Use   Smoking status: Never   Smokeless tobacco: Never  Vaping Use   Vaping Use: Never used  Substance Use Topics   Alcohol use: No   Drug use: No     Allergies   Penicillins, Amoxicillin, and Tramadol   Review of Systems Review of Systems  As stated above in HPI Physical Exam Triage Vital Signs ED Triage Vitals  Enc Vitals Group     BP 10/31/20 1247 102/67     Pulse Rate 10/31/20 1247 81     Resp 10/31/20 1249 18     Temp 10/31/20 1247 99 F (37.2 C)     Temp Source 10/31/20 1247 Oral     SpO2 10/31/20 1247 100 %     Weight --      Height --  Head Circumference --      Peak Flow --      Pain Score 10/31/20 1245 3     Pain Loc --      Pain Edu? --      Excl. in GC? --    No data found.  Updated Vital Signs BP 102/67 (BP Location: Left Arm)   Pulse 81   Temp 99 F (37.2 C) (Oral)   Resp 18   LMP 09/14/2020 (Approximate)   SpO2 100%   Physical Exam Vitals and nursing note reviewed.  Constitutional:      Appearance: She is well-developed.  Cardiovascular:     Rate and Rhythm: Normal rate and regular rhythm.  Pulmonary:     Effort: Pulmonary effort is normal.     Breath sounds: Normal breath sounds.  Abdominal:     General: Abdomen is flat. Bowel sounds are normal. There is no distension.     Palpations: Abdomen is soft.     Tenderness: There is no abdominal  tenderness. There is no right CVA tenderness, left CVA tenderness, guarding or rebound. Negative signs include Murphy's sign and McBurney's sign.     Hernia: No hernia is present.  Genitourinary:    Comments: Pt collects self swab Skin:    Coloration: Skin is not pale.  Neurological:     Mental Status: She is alert.     UC Treatments / Results  Labs (all labs ordered are listed, but only abnormal results are displayed) Labs Reviewed  POCT URINALYSIS DIPSTICK, ED / UC - Abnormal; Notable for the following components:      Result Value   Ketones, ur TRACE (*)    Hgb urine dipstick LARGE (*)    All other components within normal limits  POC URINE PREG, ED  CERVICOVAGINAL ANCILLARY ONLY    EKG   Radiology No results found.  Procedures Procedures (including critical care time)  Medications Ordered in UC Medications - No data to display  Initial Impression / Assessment and Plan / UC Course  I have reviewed the triage vital signs and the nursing notes.  Pertinent labs & imaging results that were available during my care of the patient were reviewed by me and considered in my medical decision making (see chart for details).     New.  Likely her menstrual cycle as her urine hCG is negative.  Discussed that as she is only had bleeding for 1 day and symptoms have started with a negative urine hCG this does not likely represent a miscarriage but rather the start of her cycle.  Discussed hydration with water along with iron rich diet.  Cytology pending. Final Clinical Impressions(s) / UC Diagnoses   Final diagnoses:  None   Discharge Instructions   None    ED Prescriptions   None    PDMP not reviewed this encounter.   Rushie Chestnut, [place] 10/31/20 1354

## 2020-10-31 NOTE — ED Triage Notes (Signed)
Pt in with c/o vaginal bleeding and lower abdominal pain that started today  States she had unprotected sex a few weeks ago and wants to be checked out

## 2020-11-03 ENCOUNTER — Ambulatory Visit: Payer: Self-pay

## 2020-11-03 LAB — CERVICOVAGINAL ANCILLARY ONLY
Bacterial Vaginitis (gardnerella): NEGATIVE
Candida Glabrata: NEGATIVE
Candida Vaginitis: NEGATIVE
Chlamydia: NEGATIVE
Comment: NEGATIVE
Comment: NEGATIVE
Comment: NEGATIVE
Comment: NEGATIVE
Comment: NEGATIVE
Comment: NORMAL
Neisseria Gonorrhea: NEGATIVE
Trichomonas: NEGATIVE

## 2020-11-12 ENCOUNTER — Ambulatory Visit: Payer: Medicaid Other

## 2020-11-13 NOTE — Progress Notes (Deleted)
     SUBJECTIVE:   CHIEF COMPLAINT / HPI:   [name] is a 20 y.o. female presents for ***   ***  Flowsheet Row Office Visit from 09/04/2020 in [facility]  PHQ-9 Total Score 0        Health Maintenance Due  Topic   COVID-19 Vaccine (1)   TETANUS/TDAP       PERTINENT  PMH / PSH:   OBJECTIVE:   There were no vitals taken for this visit.   General: Alert, no acute distress Cardio: Normal S1 and S2, RRR, no r/m/g Pulm: CTAB, normal work of breathing Abdomen: Bowel sounds normal. Abdomen soft and non-tender.  Extremities: No peripheral edema.  Neuro: Cranial nerves grossly intact    Pelvic Exam chaperoned by CMA **         External: normal female genitalia without lesions or masses        Vagina: normal without lesions or masses        Cervix: normal without lesions or masses        Pap smear: performed        Samples for Wet prep, GC/Chlamydia obtained   ASSESSMENT/PLAN:   No problem-specific Assessment & Plan notes found for this encounter.     Towanda Octave, MD PGY-2 [facility]

## 2020-11-14 ENCOUNTER — Ambulatory Visit: Payer: Medicaid Other

## 2020-11-19 ENCOUNTER — Encounter: Payer: Self-pay | Admitting: Family Medicine

## 2020-11-19 ENCOUNTER — Other Ambulatory Visit: Payer: Self-pay

## 2020-11-19 ENCOUNTER — Ambulatory Visit (INDEPENDENT_AMBULATORY_CARE_PROVIDER_SITE_OTHER): Payer: Self-pay | Admitting: Family Medicine

## 2020-11-19 ENCOUNTER — Other Ambulatory Visit (HOSPITAL_COMMUNITY)
Admission: RE | Admit: 2020-11-19 | Discharge: 2020-11-19 | Disposition: A | Discharge status: Home / Self Care | Payer: Medicaid Other | Source: Ambulatory Visit | Attending: Family Medicine | Admitting: Family Medicine

## 2020-11-19 VITALS — BP 118/60 | HR 87 | Ht 61.0 in | Wt 118.6 lb

## 2020-11-19 DIAGNOSIS — N760 Acute vaginitis: Secondary | ICD-10-CM | POA: Insufficient documentation

## 2020-11-19 DIAGNOSIS — N898 Other specified noninflammatory disorders of vagina: Secondary | ICD-10-CM | POA: Diagnosis not present

## 2020-11-19 DIAGNOSIS — Z113 Encounter for screening for infections with a predominantly sexual mode of transmission: Secondary | ICD-10-CM | POA: Diagnosis not present

## 2020-11-19 DIAGNOSIS — B9689 Other specified bacterial agents as the cause of diseases classified elsewhere: Secondary | ICD-10-CM

## 2020-11-19 LAB — POCT URINALYSIS DIP (MANUAL ENTRY)
Bilirubin, UA: NEGATIVE
Glucose, UA: NEGATIVE mg/dL
Ketones, POC UA: NEGATIVE mg/dL
Leukocytes, UA: NEGATIVE
Nitrite, UA: NEGATIVE
Protein Ur, POC: NEGATIVE mg/dL
Spec Grav, UA: 1.025 (ref 1.010–1.025)
Urobilinogen, UA: 0.2 E.U./dL
pH, UA: 7 (ref 5.0–8.0)

## 2020-11-19 LAB — POCT UA - MICROSCOPIC ONLY

## 2020-11-19 LAB — POCT WET PREP (WET MOUNT)
Clue Cells Wet Prep Whiff POC: NEGATIVE
Trichomonas Wet Prep HPF POC: ABSENT

## 2020-11-19 MED ORDER — METRONIDAZOLE 500 MG PO TABS: 500.0000 mg | ORAL_TABLET | Freq: Two times a day (BID) | ORAL | 0 refills | Status: AC

## 2020-11-19 NOTE — Progress Notes (Signed)
Wet  

## 2020-11-19 NOTE — Patient Instructions (Signed)
It was great seeing you today!  Please check-out at the front desk before leaving the clinic.   Visit Remembers: - Stop by the pharmacy to pick up your prescriptions  - Continue to work on your healthy eating habits and incorporating exercise into your daily life.  - Remember not to drink alcohol while taking this medication.    Regarding lab work today:  Due to recent changes in healthcare laws, you may see the results of your imaging and laboratory studies on MyChart before your provider has had a chance to review them.  I understand that in some cases there may be results that are confusing or concerning to you. Not all laboratory results come back in the same time frame and you may be waiting for multiple results in order to interpret others.  Please give [name] 72 hours in order for your provider to thoroughly review all the results before contacting the office for clarification of your results. If everything is normal, you will get a letter in the mail or a message in My Chart. Please give [name] a call if you do not hear from [name] after 2 weeks.  Please bring all of your medications with you to each visit.    If you haven't already, sign up for My Chart to have easy access to your labs results, and communication with your primary care physician.  Feel free to call with any questions or concerns at any time, at [phone].   Take care,  Dr. Katherina [facility]

## 2020-11-19 NOTE — Progress Notes (Signed)
          SUBJECTIVE:   CHIEF COMPLAINT / HPI:   Chief Complaint  Patient presents with   Vaginal Discharge   Vaginal odor    [name] is a 20 y.o. female presents for vaginal discharge.   Vaginal Discharge Having vaginal discharge for week Discharge color: brown Medications tried: Fluconazole No recent antibiotic use  - Patient reports no STIs in the past.  - Sexully active with one female partner.  -  Patient's last menstrual period was 11/18/2020. - Patient reports BV, yeast in the past.  - Patient denies douching.  -Contraception: nothing.  Possible STD exposure:   Symptoms Fever: no Dysuria:no Vaginal bleeding: yes (menstrating) Abdomen or Pelvic pain: cramping Back pain: no Genital sores or ulcers:no Rash: no Odor : "mildewy" and "fishy"   Typically uses Dove sensitive soap  but switched to [place].      PERTINENT  PMH / PSH: reviewed and updated as appropriate   OBJECTIVE:   BP 118/60   Pulse 87   Ht 5\' 1"  (1.549 m)   Wt 118 lb 9.6 oz (53.8 kg)   LMP 11/18/2020   SpO2 97%   BMI 22.41 kg/m   GEN: well appearing female in no acute distress  CVS: well perfused  RESP: speaking in full sentences without pause  ABD: soft, non-tender, non-distended, no palpable masses  Pelvic exam: normal external genitalia, vulva, VAGINA and CERVIX: lesions absent, cervical discharge present - copious, malodorous, cervical motion tenderness absent, ADNEXA: normal adnexa in size, nontender and no masses, WET MOUNT done - results: KOH done, clue cells, DNA probe for chlamydia and GC obtained, exam chaperoned by CMA.     ASSESSMENT/PLAN:   No problem-specific Assessment & Plan notes found for this encounter.    BV (bacterial vaginosis)  Confirmed on wet prep. G/C /Chlamydia, HIV and RPR are pending. Symptoms consistent with this.  - Treatment: Flagyl 500 BID x 7 days and abstain from coitus during course of treatment. Advised patient to not drink alcohol  while taking this medication.  - F/U if symptoms not improving or getting worse.  - Will f/u on G/C Chlamydia and call in Rx if positive.  - Self care instructions given including avoiding douching. Handout given.  - F/U with PCP as needed.  - Return precautions including abdominal pain, fever, chills, nausea, or vomiting given.  -Advised patient to use barrier protection/condoms.      01/19/2021, DO PGY-3 [facility]

## 2020-11-20 LAB — CERVICOVAGINAL ANCILLARY ONLY
Chlamydia: NEGATIVE
Comment: NEGATIVE
Comment: NEGATIVE
Comment: NORMAL
Neisseria Gonorrhea: NEGATIVE
Trichomonas: NEGATIVE

## 2020-11-22 ENCOUNTER — Encounter: Payer: Self-pay | Admitting: Family Medicine

## 2020-12-31 ENCOUNTER — Ambulatory Visit (INDEPENDENT_AMBULATORY_CARE_PROVIDER_SITE_OTHER): Payer: Self-pay | Admitting: Family Medicine

## 2020-12-31 DIAGNOSIS — Z5329 Procedure and treatment not carried out because of patient's decision for other reasons: Secondary | ICD-10-CM

## 2021-01-01 NOTE — Progress Notes (Signed)
Patient was not seen for this visit. Patient no showed.

## 2021-01-13 ENCOUNTER — Encounter: Payer: Medicaid Other | Admitting: Family Medicine

## 2021-02-20 ENCOUNTER — Encounter (HOSPITAL_COMMUNITY): Payer: Self-pay | Admitting: Emergency Medicine

## 2021-02-20 ENCOUNTER — Ambulatory Visit (HOSPITAL_COMMUNITY)
Admission: EM | Admit: 2021-02-20 | Discharge: 2021-02-20 | Disposition: A | Discharge status: Home / Self Care | Payer: Medicaid Other | Attending: Medical Oncology | Admitting: Medical Oncology

## 2021-02-20 DIAGNOSIS — R55 Syncope and collapse: Secondary | ICD-10-CM

## 2021-02-20 LAB — CBC
HCT: 35.1 % — ABNORMAL LOW (ref 36.0–46.0)
Hemoglobin: 11.3 g/dL — ABNORMAL LOW (ref 12.0–15.0)
MCH: 28.6 pg (ref 26.0–34.0)
MCHC: 32.2 g/dL (ref 30.0–36.0)
MCV: 88.9 fL (ref 80.0–100.0)
Platelets: 251 10*3/uL (ref 150–400)
RBC: 3.95 MIL/uL (ref 3.87–5.11)
RDW: 13.9 % (ref 11.5–15.5)
WBC: 6.1 10*3/uL (ref 4.0–10.5)
nRBC: 0 % (ref 0.0–0.2)

## 2021-02-20 LAB — POCT URINALYSIS DIPSTICK, ED / UC
Bilirubin Urine: NEGATIVE
Glucose, UA: NEGATIVE mg/dL
Leukocytes,Ua: NEGATIVE
Nitrite: NEGATIVE
Protein, ur: NEGATIVE mg/dL
Specific Gravity, Urine: 1.025 (ref 1.005–1.030)
Urobilinogen, UA: 1 mg/dL (ref 0.0–1.0)
pH: 7 (ref 5.0–8.0)

## 2021-02-20 LAB — POC URINE PREG, ED: Preg Test, Ur: NEGATIVE

## 2021-02-20 LAB — CBG MONITORING, ED: Glucose-Capillary: 96 mg/dL (ref 70–99)

## 2021-02-20 NOTE — ED Provider Notes (Signed)
[facility]    CSN: [id number] Arrival date & time: 02/20/21  1931      History   Chief Complaint Chief Complaint  Patient presents with   Nausea   Near Syncope    HPI [name] is a 20 y.o. female.   HPI  Near Syncope: Patient reports that over the past 3 to 4 months she has noticed a few episodes of near syncope.  She states that these were occurring about once a week but have slowly worsened to about 3 times a week.  Today she had this occur 3 times which is a bit higher than normal for her.  She states that when she gets these events that she feels hot and asked if she is going to pass out along with some nausea.  She states that she never passes out and never vomits.  She states that the episodes last about 10 minutes and resolve on their own.  These events happen with and without activity.  She has not tried anything for symptoms.  She denies any alcohol or drug use.  She states that she gets good sleep but often feels more tired than normal.  She denies excessive caffeine use.  She does not have any known history of anemia but does state that she gets very heavy periods.  No shortness of breath, chest pain or palpitations.  Past Medical History:  Diagnosis Date   Screen for STD (sexually transmitted disease) 02/28/2017   Seasonal allergies    Vaginal bleeding 09/17/2016   Vulvovaginal candidiasis 11/15/2014    Patient Active Problem List   Diagnosis Date Noted   Yeast infection 01/01/2020   Sore throat 12/04/2019   BV (bacterial vaginosis) 05/18/2018   Contraception management 11/28/2017   Vaginal discharge 09/30/2017   Screen for STD (sexually transmitted disease) 02/28/2017    Past Surgical History:  Procedure Laterality Date   APPENDECTOMY     LAPAROSCOPIC APPENDECTOMY N/A 11/08/2014   Procedure: APPENDECTOMY LAPAROSCOPIC;  Surgeon: Leonia Corona, MD;  Location: [facility];  Service: Pediatrics;  Laterality: N/A;    OB History   No obstetric  history on file.      Home Medications    Prior to Admission medications   Medication Sig Start Date End Date Taking? Authorizing Provider  cetirizine (ZYRTEC) 10 MG tablet Take 1 tablet (10 mg total) by mouth daily as needed for allergies. 12/21/18   Latrelle Dodrill, MD  ibuprofen (ADVIL) 800 MG tablet Take 1 tablet (800 mg total) by mouth every 8 (eight) hours as needed. 09/04/20   Latrelle Dodrill, MD  ipratropium (ATROVENT) 0.06 % nasal spray Place 2 sprays into both nostrils 4 (four) times daily. 07/12/18   Cathie Hoops, Amy V, PA-C  norelgestromin-ethinyl estradiol (ORTHO EVRA) 150-35 MCG/24HR transdermal patch Place 1 patch onto the skin once a week. 12/18/18 07/25/19  Joana Reamer, DO    Family History Family History  Problem Relation Age of Onset   Hypertension Mother    Heart disease Mother    Asthma Sister    Hypertension Maternal Grandmother    Arthritis Maternal Grandmother    Cancer Maternal Grandmother    Arthritis Maternal Grandfather     Social History Social History   Tobacco Use   Smoking status: Never   Smokeless tobacco: Never  Vaping Use   Vaping Use: Never used  Substance Use Topics   Alcohol use: No   Drug use: No     Allergies  Penicillins, Amoxicillin, and Tramadol   Review of Systems Review of Systems  As stated above in HPI Physical Exam Triage Vital Signs ED Triage Vitals  Enc Vitals Group     BP 02/20/21 1954 118/78     Pulse Rate 02/20/21 1954 80     Resp 02/20/21 1954 16     Temp 02/20/21 1954 98 F (36.7 C)     Temp Source 02/20/21 1954 Oral     SpO2 02/20/21 1954 98 %     Weight --      Height --      Head Circumference --      Peak Flow --      Pain Score 02/20/21 1953 0     Pain Loc --      Pain Edu? --      Excl. in GC? --    No data found.  Updated Vital Signs BP 118/78 (BP Location: Right Arm)   Pulse 80   Temp 98 F (36.7 C) (Oral)   Resp 16   LMP 02/17/2021   SpO2 98%   Physical Exam Vitals and  nursing note reviewed.  Constitutional:      General: She is not in acute distress.    Appearance: Normal appearance. She is not ill-appearing, toxic-appearing or diaphoretic.  HENT:     Head: Normocephalic and atraumatic.     Nose: Nose normal.     Mouth/Throat:     Mouth: Mucous membranes are moist.     Comments: Mild pallor Eyes:     Extraocular Movements: Extraocular movements intact.     Pupils: Pupils are equal, round, and reactive to light.     Comments: Mild pallor of bilateral conjunctiva  Neck:     Vascular: No carotid bruit.  Cardiovascular:     Rate and Rhythm: Normal rate and regular rhythm.     Pulses: Normal pulses.     Heart sounds: Normal heart sounds.  Pulmonary:     Effort: Pulmonary effort is normal.     Breath sounds: Normal breath sounds.  Musculoskeletal:     Cervical back: Neck supple.  Skin:    Capillary Refill: Capillary refill takes more than 3 seconds.     Coloration: Skin is pale (mild).  Neurological:     Mental Status: She is alert and oriented to person, place, and time.     Cranial Nerves: No cranial nerve deficit.     Sensory: No sensory deficit.     Motor: No weakness.     Coordination: Coordination normal.     Gait: Gait normal.     Deep Tendon Reflexes: Reflexes normal.  Psychiatric:        Mood and Affect: Mood normal.        Behavior: Behavior normal.        Thought Content: Thought content normal.        Judgment: Judgment normal.     UC Treatments / Results  Labs (all labs ordered are listed, but only abnormal results are displayed) Labs Reviewed  POCT URINALYSIS DIPSTICK, ED / UC - Abnormal; Notable for the following components:      Result Value   Ketones, ur TRACE (*)    Hgb urine dipstick TRACE (*)    All other components within normal limits  CBC  CBG MONITORING, ED  POC URINE PREG, ED    EKG   Radiology No results found.  Procedures Procedures (including critical care time)  Medications Ordered in  UC Medications - No data to display  Initial Impression / Assessment and Plan / UC Course  I have reviewed the triage vital signs and the nursing notes.  Pertinent labs & imaging results that were available during my care of the patient were reviewed by me and considered in my medical decision making (see chart for details).     New.  EKG is normal.  Her urinalysis shows trace ketones and trace hemoglobin.  The ketones likely secondary to the fact that she has not eaten in many hours.  Her CBG is normal.  Her urine pregnancy test is negative.  Symptoms likely related to anemia given her heavy periods.  I have recommended that she start a prenatal vitamin as this will offer B12 as well as iron that is generally well-tolerated.  I also want her to stay well-hydrated.  We discussed stress reduction as well.  CBC pending.  Should symptoms worsen or return I want her to go to the emergency room.  She should use caution during excessive heat or any significant cardiac activity.  She should follow-up with her PCP and may also need a referral to cardiology if her CBC is normal. Final Clinical Impressions(s) / UC Diagnoses   Final diagnoses:  None   Discharge Instructions   None    ED Prescriptions   None    PDMP not reviewed this encounter.   Rushie Chestnut, Cordelia Poche 02/20/21 2017

## 2021-02-20 NOTE — ED Triage Notes (Signed)
Reports will get feelings of nausea and going to pass out that are intermittent for 3-4 months. Reports that happened 3 times today.

## 2021-04-13 ENCOUNTER — Encounter: Payer: Self-pay | Admitting: Family Medicine

## 2021-04-13 ENCOUNTER — Other Ambulatory Visit (HOSPITAL_COMMUNITY)
Admission: RE | Admit: 2021-04-13 | Discharge: 2021-04-13 | Disposition: A | Discharge status: Home / Self Care | Payer: Medicaid Other | Source: Ambulatory Visit | Attending: Family Medicine | Admitting: Family Medicine

## 2021-04-13 ENCOUNTER — Other Ambulatory Visit: Payer: Self-pay

## 2021-04-13 ENCOUNTER — Ambulatory Visit (INDEPENDENT_AMBULATORY_CARE_PROVIDER_SITE_OTHER): Payer: Medicaid Other | Admitting: Family Medicine

## 2021-04-13 VITALS — BP 111/78 | HR 86 | Ht 61.0 in | Wt 116.1 lb

## 2021-04-13 DIAGNOSIS — Z23 Encounter for immunization: Secondary | ICD-10-CM

## 2021-04-13 DIAGNOSIS — Z113 Encounter for screening for infections with a predominantly sexual mode of transmission: Secondary | ICD-10-CM | POA: Diagnosis not present

## 2021-04-13 NOTE — Progress Notes (Signed)
    SUBJECTIVE:   CHIEF COMPLAINT / HPI:   Patient presents for concern of vaginal discharge that appeared white and creamy that she noticed 2 days ago. LMP 11/12, endorses regular cycles. Sexually active, does not want to use contraception but uses condoms for birth control. Denies dysuria, rash, vaginal pruritus, pelvic pain, fever and chills. Takes azo normally for pH balance. Last PAP alst year was normal.  OBJECTIVE:   BP 111/78   Pulse 86   Ht 5\' 1"  (1.549 m)   Wt 116 lb 2 oz (52.7 kg)   LMP 03/21/2021   SpO2 99%   BMI 21.94 kg/m    General: Patient well-appearing, in no acute distress. CV: RRR, no murmurs or gallops auscultated Resp: CTAB Abdomen: soft, nontender, nondistended GU: normal vagina and vulva, creamy white discharge noted at the cervical os, no other masses noted, no rashes or external lesions noted  Psych: mood appropriate   GU exam performed in the presence of chaperone.   ASSESSMENT/PLAN:   Screen for STD (sexually transmitted disease) -wet prep performed -HIV and RPR testing ordered per patient request  -will notify patient of any abnormal results -encouraged continue safe sex practices -patient politely declines contraception, will continue to use barrier protection -due for next PAP in 2 years   PHQ-9 score of 10 with negative question 9, reviewed.   13/04/2021, DO [facility]

## 2021-04-13 NOTE — Patient Instructions (Addendum)
It was great seeing you today!  Today we performed STI testing, I will notify you of any abnormal results and we will discuss treatment if appropriate.   You are due for your next PAP smear in 2 years.   Please follow up at your next scheduled appointment, if anything arises between now and then, please don't hesitate to contact our office.   Thank you for allowing [name] to be a part of your medical care!  Thank you, Dr. Robyne Peers

## 2021-04-13 NOTE — Progress Notes (Signed)
v

## 2021-04-13 NOTE — Assessment & Plan Note (Signed)
-  wet prep performed -HIV and RPR testing ordered per patient request  -will notify patient of any abnormal results -encouraged continue safe sex practices -patient politely declines contraception, will continue to use barrier protection -due for next PAP in 2 years

## 2021-04-14 LAB — CERVICOVAGINAL ANCILLARY ONLY
Bacterial Vaginitis (gardnerella): NEGATIVE
Candida Glabrata: NEGATIVE
Candida Vaginitis: POSITIVE — AB
Chlamydia: NEGATIVE
Comment: NEGATIVE
Comment: NEGATIVE
Comment: NEGATIVE
Comment: NEGATIVE
Comment: NEGATIVE
Comment: NORMAL
Neisseria Gonorrhea: NEGATIVE
Trichomonas: NEGATIVE

## 2021-04-15 ENCOUNTER — Other Ambulatory Visit: Payer: Self-pay | Admitting: Family Medicine

## 2021-04-15 DIAGNOSIS — B3731 Acute candidiasis of vulva and vagina: Secondary | ICD-10-CM

## 2021-04-15 MED ORDER — FLUCONAZOLE 150 MG PO TABS: 150.0000 mg | ORAL_TABLET | Freq: Once | ORAL | 0 refills | Status: AC

## 2021-04-17 ENCOUNTER — Encounter: Payer: Medicaid Other | Admitting: Family Medicine

## 2021-04-23 ENCOUNTER — Ambulatory Visit: Payer: Medicaid Other | Admitting: Family Medicine

## 2021-06-08 ENCOUNTER — Other Ambulatory Visit: Payer: Self-pay

## 2021-06-08 ENCOUNTER — Ambulatory Visit (INDEPENDENT_AMBULATORY_CARE_PROVIDER_SITE_OTHER): Payer: Self-pay | Admitting: Family Medicine

## 2021-06-08 ENCOUNTER — Other Ambulatory Visit (HOSPITAL_COMMUNITY)
Admission: RE | Admit: 2021-06-08 | Discharge: 2021-06-08 | Disposition: A | Discharge status: Home / Self Care | Payer: Medicaid Other | Source: Ambulatory Visit | Attending: Family Medicine | Admitting: Family Medicine

## 2021-06-08 ENCOUNTER — Encounter: Payer: Self-pay | Admitting: Family Medicine

## 2021-06-08 VITALS — BP 102/66 | HR 74 | Ht 61.0 in | Wt 115.8 lb

## 2021-06-08 DIAGNOSIS — N898 Other specified noninflammatory disorders of vagina: Secondary | ICD-10-CM | POA: Diagnosis not present

## 2021-06-08 DIAGNOSIS — Z114 Encounter for screening for human immunodeficiency virus [HIV]: Secondary | ICD-10-CM

## 2021-06-08 DIAGNOSIS — Z1159 Encounter for screening for other viral diseases: Secondary | ICD-10-CM

## 2021-06-08 DIAGNOSIS — Z113 Encounter for screening for infections with a predominantly sexual mode of transmission: Secondary | ICD-10-CM

## 2021-06-08 DIAGNOSIS — Z3009 Encounter for other general counseling and advice on contraception: Secondary | ICD-10-CM

## 2021-06-08 NOTE — Progress Notes (Addendum)
° ° ° °  SUBJECTIVE:   CHIEF COMPLAINT / HPI:   [name] is a 21 y.o. female presents for  vaginal discharge  Vaginal Discharge Patient reports that vaginal discharge started 3 days ago.  She notes that discharge appears creamy/tan with pink tinge and has vaginal odors. She denies vaginal pruritis, abnormal vaginal bleeding, dysuria, hematuria, frequency, abdominal pain pelvic pain, nausea, vomiting, fevers or new rash. She has not tried anything for the  discharge. No history of STIs. She is sexually active and uses condoms sometimes. She does not douche.   [place] Office Visit from 06/08/2021 in [place]  PHQ-9 Total Score 0        Health Maintenance Due  Topic   TETANUS/TDAP     PERTINENT  PMH / PSH: BV, yeast infection  OBJECTIVE:   BP 102/66    Pulse 74    Ht 5\' 1"  (1.549 m)    Wt 115 lb 12.8 oz (52.5 kg)    SpO2 100%    BMI 21.88 kg/m    General: Alert, no acute distress Cardio: well perfused  Pulm: normal work of breathing Neuro: Cranial nerves grossly intact   Pelvic Exam chaperoned by CMA April          External: normal female genitalia without lesions or masses        Vagina: normal without lesions or masses        Cervix: normal without lesions or masses        Samples for Wet prep, GC/Chlamydia obtained   ASSESSMENT/PLAN:   Contraception management Discussed birth control options for pt as pregnancy is not desired. Pt will think about and follow up with PCP. Safe sex precautions provided.   Screen for STD (sexually transmitted disease) STD and wet prep swabs obtained. Due to lab closure at 4.30pm unable to do labs today. Pt will come in later this week for them. Future lab orders for HIV, RPR and Hep c placed.    Lattie Haw, MD PGY-3 [place]

## 2021-06-08 NOTE — Patient Instructions (Signed)
Thank you for coming to see me today. It was a pleasure.   We will get some labs today.  If they are abnormal or we need to do something about them, I will call you.  If they are normal, I will send you a message on MyChart (if it is active) or a letter in the mail.  If you don't hear from [name] in 2 weeks, please call the office at the number below.   You can get labs done on Friday around 4pm.   If you have any questions or concerns, please do not hesitate to call the office at [phone].  Best wishes,   Dr Allena Katz

## 2021-06-09 NOTE — Assessment & Plan Note (Signed)
Discussed birth control options for pt as pregnancy is not desired. Pt will think about and follow up with PCP. Safe sex precautions provided.

## 2021-06-09 NOTE — Assessment & Plan Note (Addendum)
STD and wet prep swabs obtained. Due to lab closure at 4.30pm unable to do labs today. Pt will come in later this week for them. Future lab orders for HIV, RPR and Hep c placed.

## 2021-06-10 LAB — POCT WET PREP (WET MOUNT)
Clue Cells Wet Prep Whiff POC: NEGATIVE
Trichomonas Wet Prep HPF POC: ABSENT

## 2021-06-10 LAB — CERVICOVAGINAL ANCILLARY ONLY
Chlamydia: NEGATIVE
Comment: NEGATIVE
Comment: NEGATIVE
Comment: NORMAL
Neisseria Gonorrhea: NEGATIVE
Trichomonas: NEGATIVE

## 2021-06-10 NOTE — Addendum Note (Signed)
Addended by: Lamonte Sakai, Alysen Smylie D on: 06/10/2021 08:38 AM   Modules accepted: Orders

## 2021-06-11 ENCOUNTER — Other Ambulatory Visit: Payer: Self-pay | Admitting: Family Medicine

## 2021-06-12 ENCOUNTER — Other Ambulatory Visit: Payer: Self-pay | Admitting: Family Medicine

## 2021-06-12 ENCOUNTER — Other Ambulatory Visit: Payer: Medicaid Other

## 2021-06-12 MED ORDER — FLUCONAZOLE 150 MG PO TABS: 150.0000 mg | ORAL_TABLET | Freq: Once | ORAL | 0 refills | Status: AC

## 2021-07-28 ENCOUNTER — Other Ambulatory Visit: Payer: Self-pay | Admitting: Family Medicine

## 2021-07-29 ENCOUNTER — Ambulatory Visit (INDEPENDENT_AMBULATORY_CARE_PROVIDER_SITE_OTHER): Payer: Self-pay | Admitting: Student

## 2021-07-29 ENCOUNTER — Other Ambulatory Visit: Payer: Self-pay

## 2021-07-29 ENCOUNTER — Other Ambulatory Visit (HOSPITAL_COMMUNITY)
Admission: RE | Admit: 2021-07-29 | Discharge: 2021-07-29 | Disposition: A | Discharge status: Home / Self Care | Payer: Medicaid Other | Source: Ambulatory Visit | Attending: Family Medicine | Admitting: Family Medicine

## 2021-07-29 ENCOUNTER — Other Ambulatory Visit: Payer: Self-pay | Admitting: Family Medicine

## 2021-07-29 ENCOUNTER — Encounter: Payer: Self-pay | Admitting: Student

## 2021-07-29 VITALS — BP 100/70 | HR 86 | Ht 61.0 in | Wt 114.2 lb

## 2021-07-29 DIAGNOSIS — N898 Other specified noninflammatory disorders of vagina: Secondary | ICD-10-CM

## 2021-07-29 DIAGNOSIS — Z113 Encounter for screening for infections with a predominantly sexual mode of transmission: Secondary | ICD-10-CM | POA: Insufficient documentation

## 2021-07-29 LAB — POCT WET PREP (WET MOUNT)
Clue Cells Wet Prep Whiff POC: NEGATIVE
Trichomonas Wet Prep HPF POC: ABSENT

## 2021-07-29 NOTE — Progress Notes (Addendum)
?  SUBJECTIVE:  ? ?CHIEF COMPLAINT / HPI:  ? ?Vaginal discharge ?White clumpy discharge for last 2-3 days. No recent sexual activity. Used differnent kind of soap and started having discharge. Tired monostat at store, didn't work. No burning, itching, tingling, no odors. Used to get yeast infections from soaps that she was using wrong soap products. Patient notes she used fluconazole in past for yeast infections.  ? ? ?PERTINENT  PMH / PSH: Seasonal Allergies ? ?OBJECTIVE:  ?BP 100/70   Pulse 86   Ht 5\' 1"  (1.549 m)   Wt 114 lb 3.2 oz (51.8 kg)   LMP 07/08/2021 (Approximate)   SpO2 98%   BMI 21.58 kg/m?  ?Physical Exam ?Genitourinary: ?   Vagina: Vaginal discharge (white clumpy) present. No erythema, tenderness, bleeding or lesions.  ?   Cervix: Dilated. Discharge (white froathy) present. No friability, lesion, erythema or cervical bleeding.  ?General: NAD, pleasant, able to participate in exam ?Psych: Normal affect and mood ? ?ASSESSMENT/PLAN:  ?Vaginal discharge ?Patient with 2-3 days of white thick vaginal discharge, following changing soap products. Patient has history of this happening with different products. Patient denies sexual contacts, burning, itching or irritation. On exam vaginal vault had significant amount of white discharge with cottage cheese like clumps. Patient likely has yeast infection. Wet prep showed yeast. Will treat for yeast infection. Spoke with patient, she is sure she is not pregnant at this time, will treat with Diflucan. ?-Pelvic Exam, as above ?-Testing for GC/Chlamydia/Trich/Yeast ?-Diflucan 150 mg ?  ?Orders Placed This Encounter  ?Procedures  ? POCT Wet Prep [facility])  ? ?No orders of the defined types were placed in this encounter. ? ?No follow-ups on file. ?@SIGNNOTE @ ? ?

## 2021-07-29 NOTE — Patient Instructions (Signed)
It was great to see you! Thank you for allowing me to participate in your care! ? ?We saw you today for vaginal discharge. We are screening you for STI's ? ?Our plans for today:  ?- Pelvic Exam ? Checking: Gonorrhea, Chlamydia, Trichomonas, Yeast ? ?We are checking some labs today, I will call you if they are abnormal will send you a MyChart message or a letter if they are normal.  If you do not hear about your labs in the next 2 weeks please let us know. ? ?Take care and seek immediate care sooner if you develop any concerns.  ? ?Dr. Holley Bouche, MD ?[facility] ? ?

## 2021-07-29 NOTE — Assessment & Plan Note (Addendum)
Patient with 2-3 days of white thick vaginal discharge, following changing soap products. Patient has history of this happening with different products. Patient denies sexual contacts, burning, itching or irritation. On exam vaginal vault had significant amount of white discharge with cottage cheese like clumps. Patient likely has yeast infection. Wet prep showed yeast. Will treat for yeast infection. Spoke with patient, she is sure she is not pregnant at this time, will treat with Diflucan. ?-Pelvic Exam, as above ?-Testing for GC/Chlamydia/Trich/Yeast ?-Diflucan 150 mg ?

## 2021-07-30 MED ORDER — FLUCONAZOLE 150 MG PO TABS: 150.0000 mg | ORAL_TABLET | Freq: Once | ORAL | 0 refills | Status: AC

## 2021-07-30 NOTE — Addendum Note (Signed)
Addended by: Bess Kinds T on: 07/30/2021 04:39 PM ? ? Modules accepted: Orders ? ?

## 2021-07-31 LAB — CERVICOVAGINAL ANCILLARY ONLY
Chlamydia: NEGATIVE
Comment: NEGATIVE
Comment: NORMAL
Neisseria Gonorrhea: NEGATIVE

## 2021-09-07 ENCOUNTER — Encounter: Payer: Self-pay | Admitting: Family Medicine

## 2021-09-07 ENCOUNTER — Other Ambulatory Visit (HOSPITAL_COMMUNITY)
Admission: RE | Admit: 2021-09-07 | Discharge: 2021-09-07 | Disposition: A | Discharge status: Home / Self Care | Payer: Medicaid Other | Source: Ambulatory Visit | Attending: Family Medicine | Admitting: Family Medicine

## 2021-09-07 ENCOUNTER — Ambulatory Visit (INDEPENDENT_AMBULATORY_CARE_PROVIDER_SITE_OTHER): Payer: Self-pay | Admitting: Family Medicine

## 2021-09-07 VITALS — BP 103/68 | Ht 61.0 in | Wt 114.1 lb

## 2021-09-07 DIAGNOSIS — N898 Other specified noninflammatory disorders of vagina: Secondary | ICD-10-CM

## 2021-09-07 DIAGNOSIS — B3731 Acute candidiasis of vulva and vagina: Secondary | ICD-10-CM

## 2021-09-07 MED ORDER — METRONIDAZOLE 0.75 % VA GEL: 1.0000 | Freq: Two times a day (BID) | VAGINAL | 0 refills | Status: DC

## 2021-09-07 NOTE — Progress Notes (Deleted)
? ?  SUBJECTIVE:  ? ?CHIEF COMPLAINT / HPI:  ? ?Chief Complaint  ?Patient presents with  ? vaginal odor  ? ? ? ?[name] is a 21 y.o. female here for ***  ? ?Pt reports ***  ? ? ?PERTINENT  PMH / PSH: reviewed and updated as appropriate  ? ?OBJECTIVE:  ? ?BP 103/68   Ht 5\' 1"  (1.549 m)   Wt 114 lb 2 oz (51.8 kg)   LMP 09/06/2021   BMI 21.56 kg/m?   ?*** ? ?ASSESSMENT/PLAN:  ? ?No problem-specific Assessment & Plan notes found for this encounter. ?  ? ? ?Lyndee Hensen, DO ?PGY-3, [facility] ?09/07/2021  ? ? ? ? ?{    This will disappear when note is signed, click to select method of visit    :1} ? ? ? ?

## 2021-09-07 NOTE — Progress Notes (Signed)
    SUBJECTIVE:   CHIEF COMPLAINT / HPI:   Chief Complaint  Patient presents with   vaginal odor    [name] is a 21 y.o. female presents for:  Vaginal Discharge Having vaginal discharge for 2 weeks with fishy odor.  Discharge consistency: creamy Discharge color: white Medications tried: none  Denies recent antibiotic use  - Denies itching, burning, abdominal pain, dysuria or hematuria, nausea or vomiting, fevers or pelvic pain.  -Patient's last menstrual period was 09/06/2021. - Patient reports history of BV in the past.  - Patient denies douching.  -Contraception: condoms.  Previously on Depo but stopped due to mood changes.  Could not remember to take OCPs.  Does not want to have a baby anytime soon. Possible STD exposure: none. Last sexual encounter was a while ago.       PERTINENT  PMH / PSH: reviewed and updated as appropriate   OBJECTIVE:   BP 103/68   Ht 5\' 1"  (1.549 m)   Wt 114 lb 2 oz (51.8 kg)   LMP 09/06/2021   BMI 21.56 kg/m   GEN: well appearing female in no acute distress  CVS: well perfused  RESP: speaking in full sentences without pause  ABD: soft, non-tender, non-distended, no palpable masses  Pelvic exam: normal external genitalia, vulva, VAGINA and CERVIX: lesions absent, cervical motion tenderness absent, white discharge near cervix and on vaginal walls, fishy odor present, ADNEXA: normal adnexa in size, nontender and no masses, exam chaperoned by CMA Tashira.     ASSESSMENT/PLAN:    BV (bacterial vaginosis) Presumed BV given history, symptoms and exam.  Gonorrhea/Chlamydia/component is pending.  HIV and syphilis testing offered but declined.  She does not want to be on hormonal birth control at this time.  - Treatment: Metronidazole gel twice daily x 7 days and abstain from coitus during course of treatment.  - F/U if symptoms not improving or getting worse.   - Self care instructions given including avoiding douching.  - F/U with PCP  as needed.  - Return precautions including abdominal pain, fever, chills, nausea, or vomiting given.     09/08/2021, DO [facility]

## 2021-09-09 LAB — CERVICOVAGINAL ANCILLARY ONLY
Bacterial Vaginitis (gardnerella): POSITIVE — AB
Candida Glabrata: NEGATIVE
Candida Vaginitis: POSITIVE — AB
Chlamydia: NEGATIVE
Comment: NEGATIVE
Comment: NEGATIVE
Comment: NEGATIVE
Comment: NEGATIVE
Comment: NEGATIVE
Comment: NORMAL
Neisseria Gonorrhea: NEGATIVE
Trichomonas: NEGATIVE

## 2021-09-09 MED ORDER — FLUCONAZOLE 150 MG PO TABS: 150.0000 mg | ORAL_TABLET | ORAL | 0 refills | Status: AC

## 2021-09-09 NOTE — Addendum Note (Signed)
Addended by: Lyndee Hensen D on: 09/09/2021 04:55 PM ? ? Modules accepted: Orders ? ?

## 2021-09-11 ENCOUNTER — Telehealth: Payer: Self-pay

## 2021-09-11 DIAGNOSIS — B9689 Other specified bacterial agents as the cause of diseases classified elsewhere: Secondary | ICD-10-CM

## 2021-09-11 MED ORDER — CLINDAMYCIN PHOSPHATE 2 % VA CREA: 1.0000 | TOPICAL_CREAM | Freq: Every day | VAGINAL | 0 refills | Status: AC

## 2021-09-11 NOTE — Telephone Encounter (Signed)
Patient calls nurse line in regards to metrogel prescription.  ? ?Patient reports she is currently uninsured and can not afford metrogel ~80 dollars and patient has an intolerance to flagyl.  ? ?Patient reports she bought boric acid OTC, however is unsure how long she should take. Patient reports directions state insert one capsule nightly with no duration period. Patient reports #30 in box at 600mg  each.  ? ?Patient would like PCP opinion on the number of nights she should use in a row.  ? ?Please advise. ?

## 2021-09-14 NOTE — Telephone Encounter (Signed)
Patient reports she was able to pick up the Metrogel after all.  ?

## 2021-10-13 ENCOUNTER — Encounter: Payer: Self-pay | Admitting: *Deleted

## 2021-10-29 ENCOUNTER — Other Ambulatory Visit (HOSPITAL_COMMUNITY)
Admission: RE | Admit: 2021-10-29 | Discharge: 2021-10-29 | Disposition: A | Discharge status: Home / Self Care | Payer: Medicaid Other | Source: Ambulatory Visit | Attending: Family Medicine | Admitting: Family Medicine

## 2021-10-29 ENCOUNTER — Ambulatory Visit (INDEPENDENT_AMBULATORY_CARE_PROVIDER_SITE_OTHER): Payer: Self-pay | Admitting: Family Medicine

## 2021-10-29 VITALS — BP 100/50 | HR 92 | Ht 61.0 in | Wt 110.0 lb

## 2021-10-29 DIAGNOSIS — B9689 Other specified bacterial agents as the cause of diseases classified elsewhere: Secondary | ICD-10-CM

## 2021-10-29 DIAGNOSIS — Z113 Encounter for screening for infections with a predominantly sexual mode of transmission: Secondary | ICD-10-CM | POA: Insufficient documentation

## 2021-10-29 DIAGNOSIS — Z3009 Encounter for other general counseling and advice on contraception: Secondary | ICD-10-CM

## 2021-10-29 DIAGNOSIS — N760 Acute vaginitis: Secondary | ICD-10-CM

## 2021-10-29 LAB — POCT WET PREP (WET MOUNT)
Clue Cells Wet Prep Whiff POC: POSITIVE
Trichomonas Wet Prep HPF POC: ABSENT

## 2021-10-29 LAB — POCT URINE PREGNANCY: Preg Test, Ur: NEGATIVE

## 2021-10-29 MED ORDER — METRONIDAZOLE 0.75 % VA GEL: 1.0000 | Freq: Every day | VAGINAL | 0 refills | Status: AC

## 2021-10-29 MED ORDER — METRONIDAZOLE 500 MG PO TABS: 500.0000 mg | ORAL_TABLET | Freq: Two times a day (BID) | ORAL | 0 refills | Status: DC

## 2021-10-29 NOTE — Progress Notes (Unsigned)
    SUBJECTIVE:   CHIEF COMPLAINT / HPI:   ***  PERTINENT  PMH / PSH: ***  OBJECTIVE:   BP (!) 100/50   Pulse 92   Ht 5\' 1"  (1.549 m)   Wt 110 lb (49.9 kg)   LMP 10/13/2021   SpO2 98%   BMI 20.78 kg/m   ***  ASSESSMENT/PLAN:   No problem-specific Assessment & Plan notes found for this encounter.     12/13/2021, DO [facility]

## 2021-10-29 NOTE — Patient Instructions (Signed)
It was wonderful to see you today.  Please bring ALL of your medications with you to every visit.   Pick up medication from pharmacy. I will contact you when other results available. Consider nexplanon for birth control   Please be sure to schedule follow up at the front  desk before you leave today.   If you haven't already, sign up for My Chart to have easy access to your labs results, and communication with your primary care physician.  Please call the clinic at [phone] if your symptoms worsen or you have any concerns. It was our pleasure to serve you.  Dr. Salvadore Dom

## 2021-11-02 ENCOUNTER — Ambulatory Visit: Payer: Medicaid Other | Admitting: Family Medicine

## 2021-11-02 LAB — CERVICOVAGINAL ANCILLARY ONLY
Chlamydia: NEGATIVE
Comment: NEGATIVE
Comment: NORMAL
Neisseria Gonorrhea: NEGATIVE

## 2021-11-25 ENCOUNTER — Encounter (HOSPITAL_COMMUNITY): Payer: Self-pay

## 2021-11-25 ENCOUNTER — Ambulatory Visit (HOSPITAL_COMMUNITY)
Admission: EM | Admit: 2021-11-25 | Discharge: 2021-11-25 | Disposition: A | Discharge status: Home / Self Care | Payer: Medicaid Other | Attending: Family Medicine | Admitting: Family Medicine

## 2021-11-25 DIAGNOSIS — N760 Acute vaginitis: Secondary | ICD-10-CM | POA: Insufficient documentation

## 2021-11-25 MED ORDER — FLUCONAZOLE 150 MG PO TABS: 150.0000 mg | ORAL_TABLET | Freq: Once | ORAL | 0 refills | Status: AC

## 2021-11-25 MED ORDER — CLINDAMYCIN PHOSPHATE (1 DOSE) 2 % VA CREA: TOPICAL_CREAM | VAGINAL | 0 refills | Status: DC

## 2021-11-25 NOTE — ED Provider Notes (Signed)
[facility]    CSN: [id number] Arrival date & time: 11/25/21  1148      History   Chief Complaint Chief Complaint  Patient presents with   Vaginal Discharge    HPI [name] is a 21 y.o. female.    Vaginal Discharge  Here for vaginal discharge that restarted yesterday.  No vaginal irritation or itching.  She has been treated for bacterial vaginosis and Candida at various times throughout the last year.  She states that when she was treated earlier this month the symptoms did not improve but she is not sure if the bacterial vaginosis went completely away.  Last menstrual cycle was July 3.  No abdominal pain, fever, or vomiting.  Past Medical History:  Diagnosis Date   Screen for STD (sexually transmitted disease) 02/28/2017   Seasonal allergies    Vaginal bleeding 09/17/2016   Vulvovaginal candidiasis 11/15/2014    Patient Active Problem List   Diagnosis Date Noted   Yeast infection 01/01/2020   Sore throat 12/04/2019   BV (bacterial vaginosis) 05/18/2018   Contraception management 11/28/2017   Vaginal discharge 09/30/2017   Screen for STD (sexually transmitted disease) 02/28/2017    Past Surgical History:  Procedure Laterality Date   APPENDECTOMY     LAPAROSCOPIC APPENDECTOMY N/A 11/08/2014   Procedure: APPENDECTOMY LAPAROSCOPIC;  Surgeon: Leonia Corona, MD;  Location: [facility] OR;  Service: Pediatrics;  Laterality: N/A;    OB History   No obstetric history on file.      Home Medications    Prior to Admission medications   Medication Sig Start Date End Date Taking? Authorizing Provider  Clindamycin Phosphate, 1 Dose, vaginal cream 1 applicatorful per vagina once 11/25/21  Yes Maham Quintin, Janace Aris, MD  fluconazole (DIFLUCAN) 150 MG tablet Take 1 tablet (150 mg total) by mouth once for 1 dose. And repeat in 3 days 11/25/21 11/25/21 Yes Chloie Loney, Janace Aris, MD  cetirizine (ZYRTEC) 10 MG tablet Take 1 tablet (10 mg total) by mouth daily as needed for  allergies. 12/21/18   Latrelle Dodrill, MD  ipratropium (ATROVENT) 0.06 % nasal spray Place 2 sprays into both nostrils 4 (four) times daily. 07/12/18   Cathie Hoops, Amy V, PA-C  norelgestromin-ethinyl estradiol (ORTHO EVRA) 150-35 MCG/24HR transdermal patch Place 1 patch onto the skin once a week. 12/18/18 07/25/19  Joana Reamer, DO    Family History Family History  Problem Relation Age of Onset   Hypertension Mother    Heart disease Mother    Asthma Sister    Hypertension Maternal Grandmother    Arthritis Maternal Grandmother    Cancer Maternal Grandmother    Arthritis Maternal Grandfather     Social History Social History   Tobacco Use   Smoking status: Never   Smokeless tobacco: Never  Vaping Use   Vaping Use: Never used  Substance Use Topics   Alcohol use: No   Drug use: No     Allergies   Penicillins, Amoxicillin, and Tramadol   Review of Systems Review of Systems  Genitourinary:  Positive for vaginal discharge.     Physical Exam Triage Vital Signs ED Triage Vitals  Enc Vitals Group     BP 11/25/21 1158 99/64     Pulse Rate 11/25/21 1158 72     Resp 11/25/21 1158 16     Temp 11/25/21 1158 98.4 F (36.9 C)     Temp Source 11/25/21 1158 Oral     SpO2 11/25/21 1158 98 %  Weight 11/25/21 1200 110 lb (49.9 kg)     Height 11/25/21 1200 5\' 1"  (1.549 m)     Head Circumference --      Peak Flow --      Pain Score 11/25/21 1200 0     Pain Loc --      Pain Edu? --      Excl. in GC? --    No data found.  Updated Vital Signs BP 99/64 (BP Location: Left Arm)   Pulse 72   Temp 98.4 F (36.9 C) (Oral)   Resp 16   Ht 5\' 1"  (1.549 m)   Wt 49.9 kg   LMP 11/09/2021 (Exact Date)   SpO2 98%   BMI 20.78 kg/m   Visual Acuity Right Eye Distance:   Left Eye Distance:   Bilateral Distance:    Right Eye Near:   Left Eye Near:    Bilateral Near:     Physical Exam Vitals reviewed.  Constitutional:      General: She is not in acute distress. Skin:     Coloration: Skin is not jaundiced or pale.  Neurological:     Mental Status: She is alert and oriented to person, place, and time.  Psychiatric:        Behavior: Behavior normal.      UC Treatments / Results  Labs (all labs ordered are listed, but only abnormal results are displayed) Labs Reviewed  CERVICOVAGINAL ANCILLARY ONLY    EKG   Radiology No results found.  Procedures Procedures (including critical care time)  Medications Ordered in UC Medications - No data to display  Initial Impression / Assessment and Plan / UC Course  I have reviewed the triage vital signs and the nursing notes.  Pertinent labs & imaging results that were available during my care of the patient were reviewed by me and considered in my medical decision making (see chart for details).     We discussed and she decided on empiric treatment for BV and yeast.  Information is provided on bacterial vaginosis and yeast infections.  I have asked her to follow-up with her family practice office.  Staff will notify her if there is anything positive on the swab that needs other treatment Final Clinical Impressions(s) / UC Diagnoses   Final diagnoses:  Acute vaginitis     Discharge Instructions      Staff will notify you of any positives on your swab  Use clindamycin vaginal cream--1 applicatorful in your vagina once.  This is for bacterial vaginosis  Take fluconazole 150 mg--1 tablet and then the repeat the dose in 3 days  Please follow-up with your family practice office about this issue that has been recurrent for you     ED Prescriptions     Medication Sig Dispense Auth. Provider   Clindamycin Phosphate, 1 Dose, vaginal cream 1 applicatorful per vagina once 5.8 g , MD   fluconazole (DIFLUCAN) 150 MG tablet Take 1 tablet (150 mg total) by mouth once for 1 dose. And repeat in 3 days 2 tablet Vashaun Osmon, 01/10/2022, MD      PDMP not reviewed this encounter.   Zenia Resides, MD 11/25/21 1228

## 2021-11-25 NOTE — Discharge Instructions (Addendum)
Staff will notify you of any positives on your swab  Use clindamycin vaginal cream--1 applicatorful in your vagina once.  This is for bacterial vaginosis  Take fluconazole 150 mg--1 tablet and then the repeat the dose in 3 days  Please follow-up with your family practice office about this issue that has been recurrent for you

## 2021-11-25 NOTE — ED Triage Notes (Signed)
Patient states she had BV and was treated  around 11/09/21.   Patient now having vaginal discharge and odor since yesterday. White discharge.

## 2021-11-26 LAB — CERVICOVAGINAL ANCILLARY ONLY
Bacterial Vaginitis (gardnerella): POSITIVE — AB
Candida Glabrata: NEGATIVE
Candida Vaginitis: NEGATIVE
Chlamydia: NEGATIVE
Comment: NEGATIVE
Comment: NEGATIVE
Comment: NEGATIVE
Comment: NEGATIVE
Comment: NEGATIVE
Comment: NORMAL
Neisseria Gonorrhea: NEGATIVE
Trichomonas: NEGATIVE

## 2022-01-12 ENCOUNTER — Ambulatory Visit: Payer: Medicaid Other | Admitting: Student

## 2022-01-14 ENCOUNTER — Other Ambulatory Visit (HOSPITAL_COMMUNITY)
Admission: RE | Admit: 2022-01-14 | Discharge: 2022-01-14 | Disposition: A | Discharge status: Home / Self Care | Payer: Medicaid Other | Source: Ambulatory Visit | Attending: Family Medicine | Admitting: Family Medicine

## 2022-01-14 ENCOUNTER — Ambulatory Visit (INDEPENDENT_AMBULATORY_CARE_PROVIDER_SITE_OTHER): Payer: Self-pay | Admitting: Family Medicine

## 2022-01-14 VITALS — BP 101/71 | HR 78 | Ht 61.0 in | Wt 109.8 lb

## 2022-01-14 DIAGNOSIS — R1030 Lower abdominal pain, unspecified: Secondary | ICD-10-CM

## 2022-01-14 DIAGNOSIS — Z113 Encounter for screening for infections with a predominantly sexual mode of transmission: Secondary | ICD-10-CM | POA: Insufficient documentation

## 2022-01-14 DIAGNOSIS — R350 Frequency of micturition: Secondary | ICD-10-CM

## 2022-01-14 LAB — POCT URINALYSIS DIP (CLINITEK)
Bilirubin, UA: NEGATIVE
Blood, UA: NEGATIVE
Glucose, UA: NEGATIVE mg/dL
Leukocytes, UA: NEGATIVE
Nitrite, UA: NEGATIVE
POC PROTEIN,UA: NEGATIVE
Spec Grav, UA: >=1.03 — AB (ref 1.010–1.025)
Urobilinogen, UA: 0.2 U/dL
pH, UA: 5 (ref 5.0–8.0)

## 2022-01-14 LAB — POCT URINE PREGNANCY: Preg Test, Ur: NEGATIVE

## 2022-01-14 NOTE — Patient Instructions (Addendum)
It was nice seeing you today!  Results will be available on MyChart.  Schedule an appointment with myself or with [facility] colposcopy clinic for Nexplanon placement if you are interested.  Stay well, Littie Deeds, MD [facility] [phone]  --  Make sure to check out at the front desk before you leave today.  Please arrive at least 15 minutes prior to your scheduled appointments.  If you had blood work today, I will send you a MyChart message or a letter if results are normal. Otherwise, I will give you a call.  If you had a referral placed, they will call you to set up an appointment. Please give [name] a call if you don't hear back in the next 2 weeks.  If you need additional refills before your next appointment, please call your pharmacy first.

## 2022-01-14 NOTE — Assessment & Plan Note (Addendum)
Asymptomatic.  GC/chlamydia and trichomonas on swab.  HIV and RPR declined.  Advised to schedule appointment for Nexplanon placement.

## 2022-01-14 NOTE — Progress Notes (Signed)
    SUBJECTIVE:   CHIEF COMPLAINT / HPI:  Chief Complaint  Patient presents with   Abdominal Pain   Std testing    Patient is here requesting STI testing.  She does not have any symptoms including vaginal discharge or wants get tested as she had unprotected intercourse in the past 2 weeks.  No new partners in the past year, has been with the same partner.  Denies oral sex.  Also reports lower abdominal discomfort that started 3 to 4 days ago with no known triggers.  Currently rated 3 or 4 out of 10 in severity.  She has noted some urinary frequency.  Denies nausea, vomiting, diarrhea, constipation.  LMP was 1 week ago.  At previous visit was interested in Nexplanon.  PERTINENT  PMH / PSH: Appendectomy  Patient Care Team: Latrelle Dodrill, MD as PCP - General (Family Medicine)   OBJECTIVE:   BP 101/71   Pulse 78   Ht 5\' 1"  (1.549 m)   Wt 109 lb 12.8 oz (49.8 kg)   LMP 01/07/2022   SpO2 100%   BMI 20.75 kg/m   Physical Exam Exam conducted with a chaperone present.  Constitutional:      General: She is not in acute distress. Cardiovascular:     Rate and Rhythm: Normal rate.  Pulmonary:     Effort: Pulmonary effort is normal. No respiratory distress.  Abdominal:     General: Bowel sounds are normal.     Comments: Mild lower abdominal tenderness  Genitourinary:    Vagina: Normal.     Cervix: Normal.     Comments: Scant amount of white discharge noted in the vaginal vault Musculoskeletal:     Cervical back: Neck supple.  Neurological:     Mental Status: She is alert.         01/14/2022    3:24 PM  Depression screen PHQ 2/9  Decreased Interest 0  Down, Depressed, Hopeless 0  PHQ - 2 Score 0  Altered sleeping 0  Tired, decreased energy 0  Change in appetite 0  Feeling bad or failure about yourself  0  Trouble concentrating 0  Moving slowly or fidgety/restless 0  Suicidal thoughts 0  PHQ-9 Score 0  Difficult doing work/chores Not difficult at all      {Show previous vital signs (optional):23777}    ASSESSMENT/PLAN:   Screen for STD (sexually transmitted disease) Asymptomatic.  GC/chlamydia and trichomonas on swab.  HIV and RPR declined.  Advised to schedule appointment for Nexplanon placement.   Lower abdominal discomfort Acute, mild.  Urinary frequency but UA does not suggest UTI.  Negative pregnancy.  Plan to observe and await STI screening test.  Return if symptoms worsen or fail to improve.   4/9, MD [facility]

## 2022-01-18 LAB — CERVICOVAGINAL ANCILLARY ONLY
Chlamydia: NEGATIVE
Comment: NEGATIVE
Comment: NEGATIVE
Comment: NORMAL
Neisseria Gonorrhea: NEGATIVE
Trichomonas: NEGATIVE

## 2022-02-08 ENCOUNTER — Encounter: Payer: Self-pay | Admitting: Family Medicine

## 2022-02-08 ENCOUNTER — Ambulatory Visit (HOSPITAL_BASED_OUTPATIENT_CLINIC_OR_DEPARTMENT_OTHER): Payer: Self-pay | Admitting: Family Medicine

## 2022-02-08 VITALS — BP 112/78 | HR 79 | Temp 97.9°F | Resp 14 | Wt 111.0 lb

## 2022-02-08 DIAGNOSIS — R109 Unspecified abdominal pain: Secondary | ICD-10-CM

## 2022-02-08 LAB — POCT URINALYSIS DIP (MANUAL ENTRY)
Bilirubin, UA: NEGATIVE
Glucose, UA: NEGATIVE mg/dL
Ketones, POC UA: NEGATIVE mg/dL
Nitrite, UA: NEGATIVE
Protein Ur, POC: NEGATIVE mg/dL
Spec Grav, UA: 1.02 (ref 1.010–1.025)
Urobilinogen, UA: 0.2 E.U./dL
pH, UA: 8.5 — AB (ref 5.0–8.0)

## 2022-02-08 LAB — POCT UA - MICROSCOPIC ONLY: Epithelial cells, urine per micros: >20

## 2022-02-08 LAB — POCT URINE PREGNANCY: Preg Test, Ur: NEGATIVE

## 2022-02-08 MED ORDER — BACLOFEN 10 MG PO TABS: 10.0000 mg | ORAL_TABLET | Freq: Three times a day (TID) | ORAL | 0 refills | Status: DC

## 2022-02-08 MED ORDER — NAPROXEN 500 MG PO TABS: 500.0000 mg | ORAL_TABLET | Freq: Two times a day (BID) | ORAL | 0 refills | Status: DC
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
## 2022-02-08 NOTE — Patient Instructions (Signed)
It was wonderful to see you today.  Please bring ALL of your medications with you to every visit.   Today we talked about:  - Checking your blood work for your anemia - Resting and taking time for yourself  - you can use baclofen for spasms--do not use with alcohol - You can take naproxen with food  Please follow up with Dr. Ardelia Mems in 1-2 weeks    Thank you for choosing [facility].   Please call [phone] with any questions about today's appointment.  Please be sure to schedule follow up at the front  desk before you leave today.   Dorris Singh, MD  Family Medicine

## 2022-02-09 LAB — CBC
Hematocrit: 37 % (ref 34.0–46.6)
Hemoglobin: 12.4 g/dL (ref 11.1–15.9)
MCH: 29.7 pg (ref 26.6–33.0)
MCHC: 33.5 g/dL (ref 31.5–35.7)
MCV: 89 fL (ref 79–97)
Platelets: 290 10*3/uL (ref 150–450)
RBC: 4.18 x10E6/uL (ref 3.77–5.28)
RDW: 13.1 % (ref 11.7–15.4)
WBC: 4.1 10*3/uL (ref 3.4–10.8)

## 2022-02-09 LAB — COMPREHENSIVE METABOLIC PANEL
ALT: 9 IU/L (ref 0–32)
AST: 17 IU/L (ref 0–40)
Albumin/Globulin Ratio: 1.6 (ref 1.2–2.2)
Albumin: 4.4 g/dL (ref 4.0–5.0)
Alkaline Phosphatase: 45 IU/L (ref 44–121)
BUN/Creatinine Ratio: 11 (ref 9–23)
BUN: 7 mg/dL (ref 6–20)
Bilirubin Total: 0.3 mg/dL (ref 0.0–1.2)
CO2: 22 mmol/L (ref 20–29)
Calcium: 9.4 mg/dL (ref 8.7–10.2)
Chloride: 103 mmol/L (ref 96–106)
Creatinine, Ser: 0.64 mg/dL (ref 0.57–1.00)
Globulin, Total: 2.8 g/dL (ref 1.5–4.5)
Glucose: 77 mg/dL (ref 70–99)
Potassium: 4.4 mmol/L (ref 3.5–5.2)
Sodium: 140 mmol/L (ref 134–144)
Total Protein: 7.2 g/dL (ref 6.0–8.5)
eGFR: 128 mL/min/{1.73_m2} (ref >59)

## 2022-02-10 LAB — URINE CULTURE

## 2022-02-12 ENCOUNTER — Ambulatory Visit (INDEPENDENT_AMBULATORY_CARE_PROVIDER_SITE_OTHER): Payer: Self-pay | Admitting: Family Medicine

## 2022-02-12 ENCOUNTER — Other Ambulatory Visit (HOSPITAL_COMMUNITY)
Admission: RE | Admit: 2022-02-12 | Discharge: 2022-02-12 | Disposition: A | Discharge status: Home / Self Care | Payer: Medicaid Other | Source: Ambulatory Visit | Attending: Family Medicine | Admitting: Family Medicine

## 2022-02-12 VITALS — BP 100/75 | HR 90 | Ht 61.0 in | Wt 110.6 lb

## 2022-02-12 DIAGNOSIS — B379 Candidiasis, unspecified: Secondary | ICD-10-CM

## 2022-02-12 DIAGNOSIS — Z23 Encounter for immunization: Secondary | ICD-10-CM

## 2022-02-12 DIAGNOSIS — Z113 Encounter for screening for infections with a predominantly sexual mode of transmission: Secondary | ICD-10-CM

## 2022-02-12 DIAGNOSIS — N898 Other specified noninflammatory disorders of vagina: Secondary | ICD-10-CM | POA: Insufficient documentation

## 2022-02-12 LAB — POCT WET PREP (WET MOUNT)
Clue Cells Wet Prep Whiff POC: NEGATIVE
Trichomonas Wet Prep HPF POC: ABSENT

## 2022-02-12 MED ORDER — FLUCONAZOLE 150 MG PO TABS: 150.0000 mg | ORAL_TABLET | Freq: Once | ORAL | 2 refills | Status: AC

## 2022-02-12 NOTE — Assessment & Plan Note (Signed)
Declines flu vaccine today.

## 2022-02-12 NOTE — Assessment & Plan Note (Signed)
History and physical consistent with vaginal candidiasis.  Wet prep congruent.  Rx fluconazole.  Patient reports she is worried about recurrent yeast infections, amenable to Candida 6 BC swab.  Collected today, will follow result.

## 2022-02-12 NOTE — Assessment & Plan Note (Signed)
Okay with swab for gonorrhea and chlamydia.  Declines HIV and RPR.

## 2022-02-12 NOTE — Patient Instructions (Signed)
It was wonderful to see you today. Thank you for allowing me to be a part of your care. Below is a short summary of what we discussed at your visit today:  Vaginal complaint Today obtained a swab to look for yeast and trichomonas.  This is under lab and takes about 10 to 15 minutes to result.  You opted to leave and have [name] call you or send the results on MyChart.  I will let you know what the result is.  STI screening - Today we obtained a vaginal swab to screen for gonorrhea, chlamydia, and trichomonas - As above, if the results are normal, I will send you a letter or MyChart message. If the results are abnormal, I will give you a call.      Please bring all of your medications to every appointment!  If you have any questions or concerns, please do not hesitate to contact us via phone or MyChart message.   Ezequiel Essex, MD

## 2022-02-12 NOTE — Progress Notes (Signed)
    SUBJECTIVE:   CHIEF COMPLAINT / HPI:   STI check [name] is a pleasant 21 year old woman here for an STI check.  -Concern for specific exposure? None -Current symptoms: abnormal vaginal discharge (first watery, now thick and tan-greenish for the last 3 days) -Contraception: None -LMP: 02/07/22 -PAP: UTD, last 05/13/2020, NILM, positive for candida  Health maintenance Due for COVID-vaccine, flu vaccine, and Tdap  PERTINENT  PMH / PSH: BV, vaginal candidiasis  OBJECTIVE:   BP 100/75   Pulse 90   Ht 5\' 1"  (1.549 m)   Wt 110 lb 9.6 oz (50.2 kg)   LMP 02/07/2022   SpO2 98%   BMI 20.90 kg/m    Physical Exam General: Awake, alert, oriented, no acute distress Respiratory: Normal work of breathing, no respiratory distress Neuro: Cranial nerves II through X grossly intact, able to move all extremities spontaneously Vulva: Normal appearing vulva without rashes, lesions, or deformities Vagina: Pale pink rugated vaginal tissue without obvious lesions, thick malodorous discharge of whitish color, cervix without lesion or overt tenderness with swab  Sensitive exam performed with chaperone in the room:  Leonia Corona, CMA  ASSESSMENT/PLAN:   Vaginal discharge History and physical consistent with vaginal candidiasis.  Wet prep congruent.  Rx fluconazole.  Patient reports she is worried about recurrent yeast infections, amenable to Candida 6 BC swab.  Collected today, will follow result.   Routine screening for STI (sexually transmitted infection) Okay with swab for gonorrhea and chlamydia.  Declines HIV and RPR.  Flu vaccine need Declines flu vaccine today.   Ezequiel Essex, MD [place]

## 2022-02-15 ENCOUNTER — Telehealth: Payer: Self-pay

## 2022-02-15 LAB — CERVICOVAGINAL ANCILLARY ONLY
Chlamydia: NEGATIVE
Comment: NEGATIVE
Comment: NORMAL
Neisseria Gonorrhea: NEGATIVE

## 2022-02-15 NOTE — Telephone Encounter (Signed)
Pt would like Dr. Jeani Hawking to call her when she gets a chance. Pt met with Dr. Jeani Hawking last Friday. Please call [phone]. Ottis Stain, CMA

## 2022-02-17 LAB — CANDIDA 6 SPECIES PROFILE, NAA
C PARAPSILOSIS/TROPICALIS: NEGATIVE
Candida albicans, NAA: POSITIVE — AB
Candida glabrata, NAA: NEGATIVE
Candida krusei, NAA: NEGATIVE
Candida lusitaniae, NAA: NEGATIVE

## 2022-02-18 NOTE — Telephone Encounter (Signed)
Contacted pt and asked her what questions she had, she just wanted to let Dr. Jeani Hawking know that she has taken 2 doses of the fluconazole and is still having discharge. Also was wanting to confirm if she had just a yeast infection or BV or something else.  I told her according to Dr. Jeani Hawking note she had regular yeast infection. Told pt we would contact her back and let her know next steps.Meshawn Oconnor Tacy Dura Rumple, CMA            Ezequiel Essex, MD  [facility] 7 hours ago (8:35 AM)    Could you please either MyChart or call patient back to ask what her specific question is? I would prefer the chance to prechart and be ready for a specific question.   Thanks,  [employer]

## 2022-02-18 NOTE — Telephone Encounter (Signed)
Contacted pt and let her know that I spoke with Dr. Jeani Hawking and she said that pt could pick up a refill of the original Rx and take one tablet and wait 3 days and take next one and that if pt was still having symptoms after this round she would need to be revaluated to see if treatment plan needs to change. Alexiz Cothran Zimmerman Rumple, CMA

## 2022-03-01 ENCOUNTER — Encounter: Payer: Self-pay | Admitting: Student

## 2022-03-01 ENCOUNTER — Ambulatory Visit (INDEPENDENT_AMBULATORY_CARE_PROVIDER_SITE_OTHER): Payer: Self-pay | Admitting: Student

## 2022-03-01 VITALS — BP 108/62 | HR 111 | Ht 61.0 in | Wt 112.0 lb

## 2022-03-01 DIAGNOSIS — N898 Other specified noninflammatory disorders of vagina: Secondary | ICD-10-CM

## 2022-03-01 LAB — POCT WET PREP (WET MOUNT)
Clue Cells Wet Prep Whiff POC: NEGATIVE
Trichomonas Wet Prep HPF POC: ABSENT

## 2022-03-01 LAB — GLUCOSE, POCT (MANUAL RESULT ENTRY): POC Glucose: 88 mg/dl (ref 70–99)

## 2022-03-01 MED ORDER — BORIC ACID VAGINAL 600 MG VA SUPP: 600.0000 mg | Freq: Every day | VAGINAL | 0 refills | Status: DC

## 2022-03-01 MED ORDER — CLOTRIMAZOLE 1 % VA CREA: 1.0000 | TOPICAL_CREAM | Freq: Every day | VAGINAL | 0 refills | Status: AC

## 2022-03-01 NOTE — Progress Notes (Signed)
  SUBJECTIVE:   CHIEF COMPLAINT / HPI:   Vaginal discharge: Follow-up from 02/12/2022 visit , recurrent yeast infections and positive for Candida albicans.  She completes fluconazole treatment tomorrow and says the itching and irritation is resolved but she is getting a large amount of discharge. Same sexual partner. She is not on a form of birth control. She wants to know why she keeps getting BV and yeast. 3 episodes of each this year alone. Denies odor.  Denies dysuria, frequency.  Pictures provided of mucoid yellow-green discharge on pad.  She does not use any products during sexual intercourse and uses latex condoms.  PERTINENT  PMH / PSH: BV, vaginal candidiasis  OBJECTIVE:  BP 108/62   Pulse (!) 111   Ht 5\' 1"  (1.549 m)   Wt 112 lb (50.8 kg)   LMP 02/07/2022   SpO2 98%   BMI 21.16 kg/m  Pelvic exam: normal external genitalia, vulva, vagina, cervix, uterus and adnexa, VULVA: normal appearing vulva with no masses, tenderness or lesions, VAGINA: vaginal discharge - white, creamy, milky, and mucoid, exam chaperoned by Erskine Emery.  ASSESSMENT/PLAN:  Vaginal discharge Assessment & Plan: Wet mount shows many bacteria, moderate yeast, moderate KOH with negative with a negative trichomonas and negative clue cells.  She has had 3 episodes of BV and 3 episodes of yeast in the last year.  Will treat as yeast.  Requested OB referral, placed.  Diabetes ruled out as potential cause for recurrence given POCT glucose 88.  Orders: -     RPR -     HIV Antibody (routine testing w rflx) -     POCT Wet Prep (Wet Mount) -     POCT Glucose (Device for Home Use) -     Ambulatory referral to Obstetrics / Gynecology -     POCT glucose (manual entry) -     Clotrimazole; Place 1 Applicatorful vaginally at bedtime for 14 days.  Dispense: 45 g; Refill: 0 -     Boric Acid Vaginal; Place 600 mg vaginally daily.  Dispense: 14 suppository; Refill: 0  Return if symptoms worsen or fail to improve. Wells Guiles, DO 03/01/2022, 4:34 PM PGY-2, [place]

## 2022-03-01 NOTE — Patient Instructions (Addendum)
It was great to see you today! Thank you for choosing [facility] for your primary care. [name] was seen for vaginal discharge.  Today we addressed: You are still testing positive for yeast.  I prescribed clotrimazole and boric acid to be placed vaginally daily x14 days.  I have made referral to OB/GYN for the consistent infections you are dealing with.  We are also testing you for syphilis and HIV today in addition to other STDs.  You are negative for diabetes.  If you haven't already, sign up for My Chart to have easy access to your labs results, and communication with your primary care physician.  We are checking some labs today. If they are abnormal, I will call you. If they are normal, I will send you a MyChart message (if it is active) or a letter in the mail. If you do not hear about your labs in the next 2 weeks, please call the office. Call the clinic at [phone] if your symptoms worsen or you have any concerns.  You should return to our clinic Return if symptoms worsen or fail to improve. Please arrive 15 minutes before your appointment to ensure smooth check in process.  We appreciate your efforts in making this happen.  Thank you for allowing me to participate in your care, Michele Guiles, DO 03/01/2022, 2:45 PM PGY-2, [place]

## 2022-03-01 NOTE — Assessment & Plan Note (Addendum)
Wet mount shows many bacteria, moderate yeast, moderate KOH with negative with a negative trichomonas and negative clue cells.  She has had 3 episodes of BV and 3 episodes of yeast in the last year.  Will treat as yeast.  Requested OB referral, placed.  Diabetes ruled out as potential cause for recurrence given POCT glucose 88.

## 2022-03-02 LAB — HIV ANTIBODY (ROUTINE TESTING W REFLEX): HIV Screen 4th Generation wRfx: NONREACTIVE

## 2022-03-02 LAB — RPR: RPR Ser Ql: NONREACTIVE

## 2022-03-10 ENCOUNTER — Encounter: Payer: Self-pay | Admitting: Family Medicine

## 2022-03-10 ENCOUNTER — Ambulatory Visit (INDEPENDENT_AMBULATORY_CARE_PROVIDER_SITE_OTHER): Payer: Self-pay | Admitting: Family Medicine

## 2022-03-10 VITALS — BP 113/78 | HR 88 | Wt 112.2 lb

## 2022-03-10 DIAGNOSIS — G8929 Other chronic pain: Secondary | ICD-10-CM

## 2022-03-10 DIAGNOSIS — M545 Low back pain, unspecified: Secondary | ICD-10-CM | POA: Insufficient documentation
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
## 2022-03-10 NOTE — Patient Instructions (Signed)
It was great to see you! Thank you for allowing me to participate in your care!  I recommend that you always bring your medications to each appointment as this makes it easy to ensure we are on the correct medications and helps [name] not miss when refills are needed.  Our plans for today:  -I put in referral to physical therapy, they should call you to schedule an appointment to discuss a time. -Please take Naprosyn 500 mg twice a day.  You should take 1 before work and then 1 halfway through work. -As able please stop wearing your back brace. -We will fill out the paperwork for work as soon as it is sent here.    Take care and seek immediate care sooner if you develop any concerns.   Dr. Salvadore Oxford, MD [place]

## 2022-03-10 NOTE — Assessment & Plan Note (Signed)
Discussed with patient, likely diagnosis without new trauma is still chronic myofascial tension and inflammation.  Discussed stop wearing back brace.  -Naprosyn 500 mg twice daily before and during work.   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 -Referral to physical therapy -Return as needed after 4 to 5 weeks of consistent physical therapy.

## 2022-03-11 ENCOUNTER — Encounter: Payer: Self-pay | Admitting: Family Medicine

## 2022-03-18 ENCOUNTER — Ambulatory Visit: Payer: Medicaid Other

## 2022-03-18 NOTE — Progress Notes (Deleted)
    SUBJECTIVE:   CHIEF COMPLAINT / HPI:   Vaginal Discharge: Patient is a 21 y.o. female presenting with vaginal discharge for *** days.  She states the discharge is of *** consistency.  She endorses *** vaginal odor.  She is not *** interested in screening for sexually transmitted infections today. She has contraception with *** {Contraceptives:21111124}. She {DOES NOT does:27190::"does not"} use barrier method consistently.  PERTINENT  PMH / PSH: ***None relevant  OBJECTIVE:   There were no vitals taken for this visit.   General: NAD, pleasant, able to participate in exam Respiratory: Normal effort, no obvious respiratory distress Pelvic: VULVA: normal appearing vulva with no masses, tenderness or lesions, VAGINA: Normal appearing vagina with normal color, no lesions, with {GYN VAGINAL DISCHARGE:21986} discharge present, ***CERVIX: No lesions, {GYN VAGINAL DISCHARGE:21986} discharge present  Chaperone *** CMA present for pelvic exam  ASSESSMENT/PLAN:   No problem-specific Assessment & Plan notes found for this encounter.   Assessment:  21 y.o. female with vaginal discharge for***days, as well as***.  Physical exam significant for*** discharge.  Wet prep performed today shows *** consistent with ***.  Patient is not interested in STI screening.   Plan: -Wet prep as above.  Will treat with***. -Discussed protection during intercourse and contraceptive methods*** -Follow-up as needed  Levin Erp, MD [facility]

## 2022-03-19 ENCOUNTER — Other Ambulatory Visit (HOSPITAL_COMMUNITY)
Admission: RE | Admit: 2022-03-19 | Discharge: 2022-03-19 | Disposition: A | Discharge status: Home / Self Care | Payer: Medicaid Other | Source: Ambulatory Visit | Attending: Family Medicine | Admitting: Family Medicine

## 2022-03-19 ENCOUNTER — Ambulatory Visit (INDEPENDENT_AMBULATORY_CARE_PROVIDER_SITE_OTHER): Payer: Self-pay | Admitting: Family Medicine

## 2022-03-19 VITALS — BP 110/70 | HR 83 | Ht 61.0 in | Wt 110.2 lb

## 2022-03-19 DIAGNOSIS — N898 Other specified noninflammatory disorders of vagina: Secondary | ICD-10-CM | POA: Insufficient documentation

## 2022-03-19 DIAGNOSIS — B9689 Other specified bacterial agents as the cause of diseases classified elsewhere: Secondary | ICD-10-CM

## 2022-03-19 DIAGNOSIS — N760 Acute vaginitis: Secondary | ICD-10-CM

## 2022-03-19 DIAGNOSIS — B3731 Acute candidiasis of vulva and vagina: Secondary | ICD-10-CM

## 2022-03-19 DIAGNOSIS — Z113 Encounter for screening for infections with a predominantly sexual mode of transmission: Secondary | ICD-10-CM

## 2022-03-19 LAB — POCT WET PREP (WET MOUNT)
Clue Cells Wet Prep Whiff POC: NEGATIVE
Trichomonas Wet Prep HPF POC: ABSENT
WBC, Wet Prep HPF POC: >20

## 2022-03-19 MED ORDER — FLUCONAZOLE 150 MG PO TABS: ORAL_TABLET | ORAL | 0 refills | Status: DC

## 2022-03-19 MED ORDER — METRONIDAZOLE 500 MG PO TABS: 500.0000 mg | ORAL_TABLET | Freq: Two times a day (BID) | ORAL | 0 refills | Status: AC

## 2022-03-19 MED ORDER — BORIC ACID VAGINAL 600 MG VA SUPP: 600.0000 mg | Freq: Every day | VAGINAL | 0 refills | Status: AC

## 2022-03-19 NOTE — Assessment & Plan Note (Signed)
Recurrent. While wet prep was not suggestive of BV today, patient with at least 3 episodes in the past year. Will treat with Flagyl 500mg  BID x7 days as well as maintenance boric acid 600mg  vaginally at bedtime x30 days.

## 2022-03-19 NOTE — Progress Notes (Signed)
    SUBJECTIVE:   CHIEF COMPLAINT / HPI:   Recurrent Yeast & BV -Patient reports vaginal irritation/itching -Symptoms started 2 days ago -Now having vaginal discharge as well, clumpy, tan in color -No urinary symptoms -Recently finished menstrual cycle -Sexually active, no new partners -Uses condoms for contraception  -4 episodes of yeast and 4 of BV in the past year -Had full yeast speciation testing last month-- was positive only for candida albicans -Boric acid prescribed at last visit, patient not using, was unaware of this  PERTINENT  PMH / PSH: none pertinent  OBJECTIVE:   BP 110/70   Pulse 83   Ht 5\' 1"  (1.549 m)   Wt 110 lb 3.2 oz (50 kg)   LMP 03/11/2022   SpO2 99%   BMI 20.82 kg/m   General: NAD, pleasant, able to participate in exam Respiratory: No respiratory distress Skin: warm and dry, no rashes noted Psych: Normal affect and mood Neuro: grossly intact GU/GYN: Exam performed in the presence of a chaperone. External genitalia within normal limits.  Vaginal mucosa pink, moist, normal rugae.  Nonfriable cervix without lesions. Green discharge noted on speculum exam. No bleeding.   ASSESSMENT/PLAN:   Recurrent candidiasis of vagina Recurrent. 4 episodes so far this year. Speciation (performed 1 month ago) showed candida albicans only. Will treat with fluconazole 150mg  every 72 hours x3 doses for induction and then fluconazole 150mg  once weekly x6 months for maintenance.  BV (bacterial vaginosis) Recurrent. While wet prep was not suggestive of BV today, patient with at least 3 episodes in the past year. Will treat with Flagyl 500mg  BID x7 days as well as maintenance boric acid 600mg  vaginally at bedtime x30 days.   Routine Screening for STI -await gc/chlamydia results -recent had negative HIV/RPR, repeat not indicated today  13/06/2021, MD [facility]

## 2022-03-19 NOTE — Patient Instructions (Addendum)
It was great to see you!  Because you've had recurrent yeast infections, we will do an extended treatment regimen: Oral fluconazole (diflucan) -Take 1 pill every 72 hours x3 doses -Then take 1 pill once weekly for 6 months  Because you've had recurrent BV, we will do an extended treatment regimen: -Oral metronidazole (flagyl) 500mg  twice daily x7 days -Vaginal boric acid every night at bedtime x30 days Start these both tonight (or as soon as you pick them up)  These prescriptions are at your pharmacy.  See back in 4-6 weeks to check in.  I will send you a mychart message with the results of your testing.  -Dr [name]

## 2022-03-19 NOTE — Assessment & Plan Note (Signed)
Recurrent. 4 episodes so far this year. Speciation (performed 1 month ago) showed candida albicans only. Will treat with fluconazole 150mg  every 72 hours x3 doses for induction and then fluconazole 150mg  once weekly x6 months for maintenance.

## 2022-03-22 LAB — CERVICOVAGINAL ANCILLARY ONLY
Bacterial Vaginitis (gardnerella): POSITIVE — AB
Candida Glabrata: NEGATIVE
Candida Vaginitis: POSITIVE — AB
Chlamydia: NEGATIVE
Comment: NEGATIVE
Comment: NEGATIVE
Comment: NEGATIVE
Comment: NEGATIVE
Comment: NEGATIVE
Comment: NORMAL
Neisseria Gonorrhea: NEGATIVE
Trichomonas: NEGATIVE
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
## 2022-03-24 ENCOUNTER — Ambulatory Visit: Payer: BLUE CROSS/BLUE SHIELD | Attending: Family Medicine | Admitting: Physical Therapy

## 2022-03-31 ENCOUNTER — Other Ambulatory Visit: Payer: Self-pay | Admitting: Family Medicine

## 2022-04-12 ENCOUNTER — Telehealth: Payer: Self-pay | Admitting: Family Medicine

## 2022-04-12 NOTE — Telephone Encounter (Signed)
Patient came in asking about getting a note for work stating indefinitely for her back issues so that they can switch her job position. States she sent a mychart message on 04/08/22 and hasn't gotten a response. If someone could please call her to let her know when note is available, or with any questions, [phone].

## 2022-04-13 NOTE — Telephone Encounter (Signed)
It appears Dr. Velna Ochs replied to patient's message (this was never sent to me)  Let me know if I can be of further assistance  Latrelle Dodrill, MD

## 2022-04-16 ENCOUNTER — Telehealth: Payer: Self-pay | Admitting: Family Medicine

## 2022-04-16 ENCOUNTER — Encounter: Payer: Self-pay | Admitting: Family Medicine

## 2022-04-16 ENCOUNTER — Ambulatory Visit (INDEPENDENT_AMBULATORY_CARE_PROVIDER_SITE_OTHER): Payer: Self-pay | Admitting: Family Medicine

## 2022-04-16 VITALS — BP 118/64 | HR 97 | Ht 61.0 in | Wt 109.4 lb

## 2022-04-16 DIAGNOSIS — G8929 Other chronic pain: Secondary | ICD-10-CM

## 2022-04-16 DIAGNOSIS — M545 Low back pain, unspecified: Secondary | ICD-10-CM

## 2022-04-16 NOTE — Assessment & Plan Note (Addendum)
Patient is improved since last visit as she has not been working.  Has not started physical therapy.  Discussed filling out short-term FMLA paperwork so she may do physical therapy while not continually aggravating her back, as her job did not adhere to accommodations I previously instructed.  Patient stated she would send me short-term FMLA paperwork through MyChart, I will be on the look out. -Repeat referral to physical therapy -Tylenol as needed for pain
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
## 2022-04-16 NOTE — Patient Instructions (Signed)
It was great to see you! Thank you for allowing me to participate in your care!  I recommend that you always bring your medications to each appointment as this makes it easy to ensure we are on the correct medications and helps [name] not miss when refills are needed.  Our plans for today:  -Please send me the FMLA short-term paperwork.  So that I may excuse you from work while you do physical therapy for 2 months. -   Take care and seek immediate care sooner if you develop any concerns.   Dr. Celine Mans, MD [facility]

## 2022-04-23 ENCOUNTER — Encounter (HOSPITAL_BASED_OUTPATIENT_CLINIC_OR_DEPARTMENT_OTHER): Payer: Self-pay | Admitting: Emergency Medicine

## 2022-04-23 ENCOUNTER — Other Ambulatory Visit: Payer: Self-pay

## 2022-04-23 DIAGNOSIS — Z5321 Procedure and treatment not carried out due to patient leaving prior to being seen by health care provider: Secondary | ICD-10-CM | POA: Diagnosis not present

## 2022-04-23 DIAGNOSIS — R1030 Lower abdominal pain, unspecified: Secondary | ICD-10-CM | POA: Diagnosis present

## 2022-04-23 LAB — COMPREHENSIVE METABOLIC PANEL
ALT: 14 U/L (ref 0–44)
AST: 18 U/L (ref 15–41)
Albumin: 4.4 g/dL (ref 3.5–5.0)
Alkaline Phosphatase: 38 U/L (ref 38–126)
Anion gap: 10 (ref 5–15)
BUN: 11 mg/dL (ref 6–20)
CO2: 27 mmol/L (ref 22–32)
Calcium: 9.9 mg/dL (ref 8.9–10.3)
Chloride: 101 mmol/L (ref 98–111)
Creatinine, Ser: 0.82 mg/dL (ref 0.44–1.00)
GFR, Estimated: >60 mL/min (ref >60)
Glucose, Bld: 90 mg/dL (ref 70–99)
Potassium: 3.5 mmol/L (ref 3.5–5.1)
Sodium: 138 mmol/L (ref 135–145)
Total Bilirubin: 0.3 mg/dL (ref 0.3–1.2)
Total Protein: 7.9 g/dL (ref 6.5–8.1)

## 2022-04-23 LAB — URINALYSIS, ROUTINE W REFLEX MICROSCOPIC
Bilirubin Urine: NEGATIVE
Glucose, UA: NEGATIVE mg/dL
Hgb urine dipstick: NEGATIVE
Nitrite: NEGATIVE
Protein, ur: 30 mg/dL — AB
Specific Gravity, Urine: 1.032 — ABNORMAL HIGH (ref 1.005–1.030)
pH: 6 (ref 5.0–8.0)

## 2022-04-23 LAB — CBC
HCT: 39 % (ref 36.0–46.0)
Hemoglobin: 12.8 g/dL (ref 12.0–15.0)
MCH: 29 pg (ref 26.0–34.0)
MCHC: 32.8 g/dL (ref 30.0–36.0)
MCV: 88.4 fL (ref 80.0–100.0)
Platelets: 343 10*3/uL (ref 150–400)
RBC: 4.41 MIL/uL (ref 3.87–5.11)
RDW: 13.4 % (ref 11.5–15.5)
WBC: 7.4 10*3/uL (ref 4.0–10.5)
nRBC: 0 % (ref 0.0–0.2)

## 2022-04-23 LAB — LIPASE, BLOOD: Lipase: 37 U/L (ref 11–51)

## 2022-04-23 LAB — PREGNANCY, URINE: Preg Test, Ur: NEGATIVE

## 2022-04-23 NOTE — ED Triage Notes (Signed)
Pt present to ED POV. Pt c/o suprapubic abd pain x1w. Pt reports that that she has been being treated for yeast infections and BV x20m. Not taking antibiotics compliantly.

## 2022-04-24 ENCOUNTER — Emergency Department (HOSPITAL_BASED_OUTPATIENT_CLINIC_OR_DEPARTMENT_OTHER)
Admission: EM | Admit: 2022-04-24 | Discharge: 2022-04-24 | Discharge status: Against Medical Advice | Payer: BLUE CROSS/BLUE SHIELD | Attending: Emergency Medicine | Admitting: Emergency Medicine

## 2022-04-25 ENCOUNTER — Emergency Department (HOSPITAL_BASED_OUTPATIENT_CLINIC_OR_DEPARTMENT_OTHER)
Admission: EM | Admit: 2022-04-25 | Discharge: 2022-04-25 | Discharge status: Against Medical Advice | Payer: BLUE CROSS/BLUE SHIELD | Attending: Emergency Medicine | Admitting: Emergency Medicine

## 2022-04-25 ENCOUNTER — Encounter (HOSPITAL_BASED_OUTPATIENT_CLINIC_OR_DEPARTMENT_OTHER): Payer: Self-pay

## 2022-04-25 DIAGNOSIS — Z5321 Procedure and treatment not carried out due to patient leaving prior to being seen by health care provider: Secondary | ICD-10-CM | POA: Diagnosis not present

## 2022-04-25 DIAGNOSIS — R110 Nausea: Secondary | ICD-10-CM | POA: Insufficient documentation

## 2022-04-25 DIAGNOSIS — N898 Other specified noninflammatory disorders of vagina: Secondary | ICD-10-CM | POA: Diagnosis present

## 2022-04-25 NOTE — ED Triage Notes (Signed)
Abdominal pain for week with vaginal discharge. Pain across lower abd.  Denies vomiting Have nausea.  States treated for yeast infection three days ago but still having some discharge

## 2022-04-25 NOTE — ED Notes (Signed)
2nd call for pt, no answer 

## 2022-04-25 NOTE — ED Notes (Signed)
Called 1x for labs, no answer

## 2022-04-26 ENCOUNTER — Encounter: Payer: Self-pay | Admitting: Student

## 2022-04-26 ENCOUNTER — Ambulatory Visit (INDEPENDENT_AMBULATORY_CARE_PROVIDER_SITE_OTHER): Payer: BLUE CROSS/BLUE SHIELD | Admitting: Student

## 2022-04-26 ENCOUNTER — Other Ambulatory Visit (HOSPITAL_COMMUNITY)
Admission: RE | Admit: 2022-04-26 | Discharge: 2022-04-26 | Disposition: A | Discharge status: Home / Self Care | Payer: BLUE CROSS/BLUE SHIELD | Source: Ambulatory Visit | Attending: Family Medicine | Admitting: Family Medicine

## 2022-04-26 VITALS — BP 100/64 | HR 87 | Ht 61.0 in | Wt 109.6 lb

## 2022-04-26 DIAGNOSIS — N898 Other specified noninflammatory disorders of vagina: Secondary | ICD-10-CM | POA: Insufficient documentation

## 2022-04-26 NOTE — Progress Notes (Signed)
    SUBJECTIVE:   CHIEF COMPLAINT / HPI:   21 y.o.  year old female presents with vaginal discharge that started 5 days ago.  She denies increased frequency and urgency.  She is not sexually active but endorses use of sex toys.  Last oral intake call was about 2 months ago. Of not reports allergy to latex from use of condoms in the past. Endorses vaginal itchiness or odor. No hematuria  Endorses mild abdominal pain but resolved today. LMP 04/05/2022 Used some Fluconazole pills from left over from previously prescribed She reports multiple infection this year with BV and candida. Endorses compliance with her treatments and have tried to change bathing soaps multiple times  PERTINENT  PMH / PSH: Reviewed  OBJECTIVE:   BP 100/64   Pulse 87   Ht 5\' 1"  (1.549 m)   Wt 109 lb 9.6 oz (49.7 kg)   LMP 04/05/2022 (Approximate)   SpO2 97%   BMI 20.71 kg/m    Physical Exam General: Alert, well appearing, NAD Cardiovascular: RRR, well perfused Respiratory: Normal work of breathing on room air Genitalia:  Normal introitus for age, no external lesions, whitish-green copious vaginal discharge without odor, mucosa pink and moist, no vaginal or cervical lesions, no vaginal atrophy, no friaility or hemorrhage, normal uterus size and position   ASSESSMENT/PLAN:   Vaginal discharge Patient with complain of vaginal discharge with irritation and odor.Her exam showed white-greenish copious vaginal discharge without odor.  Clear cause of recurrent infection especially in the absence of sexual intercourse.  Considered causes include possible reaction to sex toys used by patient. -Follow up with lab for GC, Chlamydia, Trichomonas, BV and Candida. -Will consider sending out culture pending lab finding to 04/07/2022 for sensitivity given recurrent issue -Advised patient to abstain from use of sex toys for 6 to 8 weeks to evaluate ability of allergic reaction -Recommend use of boric acid until sample  resulted.   [name], MD [facility]

## 2022-04-26 NOTE — Patient Instructions (Addendum)
It was wonderful to meet you today. Thank you for allowing me to be a part of your care. Below is a short summary of what we discussed at your visit today:  Exam showed copious whitish-green discharge around the vaginal vulvar wall.  Samples were collected which will send out for testing.  Pending the results of the test we will likely send out culture to make sure this is not a resistant organism.  I also recommend holding off on the use of your sex toys for couple of weeks because you may also have like allergy aggressive reaction causing infection.  Also use over-the-counter boric acid for now until your results come back, then I can send in prescriptions for you.  Will follow-up with you on the results.  Please bring all of your medications to every appointment!  If you have any questions or concerns, please do not hesitate to contact us via phone or MyChart message.   Jerre Simon, MD [facility]

## 2022-04-27 LAB — CERVICOVAGINAL ANCILLARY ONLY
Bacterial Vaginitis (gardnerella): POSITIVE — AB
Candida Glabrata: NEGATIVE
Candida Vaginitis: POSITIVE — AB
Chlamydia: NEGATIVE
Comment: NEGATIVE
Comment: NEGATIVE
Comment: NEGATIVE
Comment: NEGATIVE
Comment: NEGATIVE
Comment: NORMAL
Neisseria Gonorrhea: NEGATIVE
Trichomonas: NEGATIVE

## 2022-04-28 ENCOUNTER — Other Ambulatory Visit: Payer: Self-pay | Admitting: Student

## 2022-04-28 ENCOUNTER — Telehealth: Payer: Self-pay

## 2022-04-28 ENCOUNTER — Ambulatory Visit (INDEPENDENT_AMBULATORY_CARE_PROVIDER_SITE_OTHER): Payer: BLUE CROSS/BLUE SHIELD | Admitting: Student

## 2022-04-28 VITALS — BP 92/68 | HR 97 | Ht 61.0 in | Wt 109.0 lb

## 2022-04-28 DIAGNOSIS — B3731 Acute candidiasis of vulva and vagina: Secondary | ICD-10-CM

## 2022-04-28 DIAGNOSIS — R1030 Lower abdominal pain, unspecified: Secondary | ICD-10-CM | POA: Diagnosis not present

## 2022-04-28 MED ORDER — HYOSCYAMINE SULFATE 0.125 MG PO TBDP: 0.1250 mg | ORAL_TABLET | Freq: Four times a day (QID) | ORAL | 0 refills | Status: DC | PRN

## 2022-04-28 MED ORDER — METRONIDAZOLE 1.3 % VA GEL: 5.0000 g | Freq: Every evening | VAGINAL | 0 refills | Status: DC

## 2022-04-28 MED ORDER — FLUCONAZOLE 150 MG PO TABS: ORAL_TABLET | ORAL | 0 refills | Status: DC

## 2022-04-28 NOTE — Patient Instructions (Signed)
It was great to see you! Thank you for allowing me to participate in your care!  It looks like you are having diarrhea that could be coming from food you are eating. We will want to track this!  Our plans for today:  - Abdominal pain  Hyoscyamine every four hours as needed - Diarrhea  Keep food diary of what you are eating and symptoms you have  Attempt to cut out different foods and see if makes a difference, (carbs/fats)  Increase the amount of fiber you eat (more leafy vegetables or over the counter fiber)  - Follow up in 2 weeks or so, if not improving/sooner if worsening - Seek medical care if:  Passing out while standing  Diarrhea uncontrolled an having problem staying hydrated (not making urin, dry lips/throat)  Take care and seek immediate care sooner if you develop any concerns.   Dr. Bess Kinds, MD [facility]

## 2022-04-28 NOTE — Progress Notes (Signed)
  SUBJECTIVE:   CHIEF COMPLAINT / HPI:   Abdominal pain Been having stomach pain for 2 months. Pain feels like when her appendix busted. Is having watery diarrhea, and has the runs whenever she eats. Has tried TUMS, and peptobismal. Pain is sharp and achey, and sits in stomach area. Also appreciates worse stomach pain with the diarrhea. Heating pad helps. Pain is in lower stomach/pelvis. Has irregular periods, LMP 11/27-12/4, noted pretty bad cramping. Eating/drinking make the pain worse. Peeing is normal. No fever's but was warm one day, no emesis but has had nausea.   PERTINENT  PMH / PSH:    OBJECTIVE:  BP 92/68   Pulse 97   Ht 5\' 1"  (1.549 m)   Wt 109 lb (49.4 kg)   LMP 04/05/2022 (Approximate)   SpO2 99%   BMI 20.60 kg/m  Physical Exam Constitutional:      General: She is not in acute distress.    Appearance: Normal appearance. She is not ill-appearing.  Cardiovascular:     Rate and Rhythm: Normal rate and regular rhythm.     Pulses: Normal pulses.     Heart sounds: Normal heart sounds. No murmur heard.    No friction rub. No gallop.  Pulmonary:     Effort: Pulmonary effort is normal. No respiratory distress.     Breath sounds: Normal breath sounds. No stridor. No wheezing, rhonchi or rales.  Abdominal:     General: Abdomen is flat. There is no distension.     Palpations: Abdomen is soft. There is no mass.     Tenderness: There is abdominal tenderness. There is no guarding or rebound.  Skin:    Capillary Refill: Capillary refill takes less than 2 seconds.  Neurological:     Mental Status: She is alert.      ASSESSMENT/PLAN:  Lower abdominal pain Assessment & Plan: Patient notes 2 months of lower abdominal pain that is sharp/achy and accompanied with diarrhea. Patient notes heating pads help, but she's having diarrhea whenever she eats. Patient notes heating pad helps with pain. Patient has tried 04/07/2022 and peptobismal w/o relief. Patient has hx of irregular periods,  but reports her LMP was 11/27-12/4. Patient also notes she is peeing like normal. Etiology of the abdominal pain is likely related to the diarrhea. Unsure what is causing the diarrhea. Less concerned for gynecological issue as issues not related to menstrual cycle. Patient with no infectious symptoms and too frequently for food poisoning. Patient may have allergy to foods. Will encourage diary. -Hyoscyamine for abdominal pain/cramps -Food diary for diarrhea -f/u 2 weeks -ED precautions for dehydration given -Consider GI referral if no relief/pattern identified    Other orders -     Hyoscyamine Sulfate; Place 1 tablet (0.125 mg total) under the tongue every 6 (six) hours as needed.  Dispense: 30 tablet; Refill: 0   No follow-ups on file. 03-30-1979, MD 04/29/2022, 6:17 PM PGY-2, [facility]

## 2022-04-28 NOTE — Progress Notes (Signed)
Patient tested positive for Candida and BV. At last visit expressed preference for the vaginal gel over the pill for metronidazole.

## 2022-04-28 NOTE — Telephone Encounter (Signed)
Patient calls nurse line requesting treatment for BV and yeast.   She reports she was seen 12/18 and reviewed results on mychart.   Will forward to provider who saw patient.

## 2022-04-29 ENCOUNTER — Other Ambulatory Visit: Payer: Self-pay | Admitting: Student

## 2022-04-29 ENCOUNTER — Telehealth: Payer: Self-pay

## 2022-04-29 DIAGNOSIS — B3731 Acute candidiasis of vulva and vagina: Secondary | ICD-10-CM

## 2022-04-29 DIAGNOSIS — R1030 Lower abdominal pain, unspecified: Secondary | ICD-10-CM | POA: Insufficient documentation

## 2022-04-29 MED ORDER — TERCONAZOLE 0.4 % VA CREA: 1.0000 | TOPICAL_CREAM | Freq: Every day | VAGINAL | 0 refills | Status: DC

## 2022-04-29 MED ORDER — TERCONAZOLE 0.4 % VA CREA: 1.0000 | TOPICAL_CREAM | Freq: Every day | VAGINAL | 0 refills | Status: AC

## 2022-04-29 MED ORDER — TERCONAZOLE 0.8 % VA CREA: 1.0000 | TOPICAL_CREAM | Freq: Every day | VAGINAL | 0 refills | Status: DC

## 2022-04-29 NOTE — Assessment & Plan Note (Signed)
Patient notes 2 months of lower abdominal pain that is sharp/achy and accompanied with diarrhea. Patient notes heating pads help, but she's having diarrhea whenever she eats. Patient notes heating pad helps with pain. Patient has tried Scientist, research (medical) and peptobismal w/o relief. Patient has hx of irregular periods, but reports her LMP was 11/27-12/4. Patient also notes she is peeing like normal. Etiology of the abdominal pain is likely related to the diarrhea. Unsure what is causing the diarrhea. Less concerned for gynecological issue as issues not related to menstrual cycle. Patient with no infectious symptoms and too frequently for food poisoning. Patient may have allergy to foods. Will encourage diary. -Hyoscyamine for abdominal pain/cramps -Food diary for diarrhea -f/u 2 weeks -ED precautions for dehydration given -Consider GI referral if no relief/pattern identified

## 2022-04-29 NOTE — Progress Notes (Signed)
Terazol for recurrent Candida infx

## 2022-04-29 NOTE — Telephone Encounter (Signed)
[facility] calls nurse line requesting clarification on Terconazole Cream.   Pharmacy reports the 3 day cream was called in, however the direction state for 7 days.   Pharmacy reports if 7 days in intended please send in Terconazole 7.   If 3 days is intended please resend with 3 day directions.   Will forward to ordering provider.

## 2022-04-30 ENCOUNTER — Other Ambulatory Visit (HOSPITAL_COMMUNITY): Payer: Self-pay

## 2022-05-04 ENCOUNTER — Other Ambulatory Visit: Payer: Self-pay

## 2022-05-04 ENCOUNTER — Ambulatory Visit: Payer: BLUE CROSS/BLUE SHIELD | Attending: Family Medicine

## 2022-05-04 DIAGNOSIS — G8929 Other chronic pain: Secondary | ICD-10-CM | POA: Insufficient documentation

## 2022-05-04 DIAGNOSIS — M545 Low back pain, unspecified: Secondary | ICD-10-CM | POA: Insufficient documentation

## 2022-05-04 DIAGNOSIS — R293 Abnormal posture: Secondary | ICD-10-CM | POA: Diagnosis present

## 2022-05-04 DIAGNOSIS — M6281 Muscle weakness (generalized): Secondary | ICD-10-CM | POA: Insufficient documentation
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
## 2022-05-06 ENCOUNTER — Other Ambulatory Visit (HOSPITAL_COMMUNITY): Payer: Self-pay

## 2022-05-06 ENCOUNTER — Telehealth: Payer: Self-pay

## 2022-05-06 NOTE — Telephone Encounter (Signed)
A Prior Authorization was initiated for this patients NUVESSA GEL through CoverMyMeds.   Key: Wallace Cullens

## 2022-05-07 ENCOUNTER — Ambulatory Visit (INDEPENDENT_AMBULATORY_CARE_PROVIDER_SITE_OTHER): Payer: BLUE CROSS/BLUE SHIELD | Admitting: Family Medicine

## 2022-05-07 ENCOUNTER — Encounter: Payer: Self-pay | Admitting: Family Medicine

## 2022-05-07 VITALS — BP 100/74 | HR 70 | Ht 61.0 in | Wt 110.4 lb

## 2022-05-07 DIAGNOSIS — G8929 Other chronic pain: Secondary | ICD-10-CM

## 2022-05-07 DIAGNOSIS — M545 Low back pain, unspecified: Secondary | ICD-10-CM | POA: Diagnosis not present
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
## 2022-05-07 NOTE — Patient Instructions (Signed)
It was great seeing you today!  Today we discussed you wanting to return to work, I think this is very reasonable. Please be safe and continue participating in physical therapy. Continue using a heating pad and taking tylenol for pain as needed. I have provided you a letter for work with some accommodations.   Please follow up at your next scheduled appointment, if anything arises between now and then, please don't hesitate to contact our office.   Thank you for allowing [name] to be a part of your medical care!  Thank you, Dr. Robyne Peers  Also a reminder of our clinic's no-show policy. Please make sure to arrive at least 15 minutes prior to your scheduled appointment time. Please try to cancel before 24 hours if you are not able to make it. If you no-show for 2 appointments then you will be receiving a warning letter. If you no-show after 3 visits, then you may be at risk of being dismissed from our clinic. This is to ensure that everyone is able to be seen in a timely manner. Thank you, we appreciate your assistance with this!

## 2022-05-07 NOTE — Telephone Encounter (Signed)
Prior Auth for patients medication NUVESSA approved by [employer] from 05/07/22 to 06/07/22.  Key: Wallace Cullens
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
## 2022-05-10 DIAGNOSIS — Z419 Encounter for procedure for purposes other than remedying health state, unspecified: Secondary | ICD-10-CM | POA: Diagnosis not present
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
## 2022-05-13 ENCOUNTER — Ambulatory Visit: Payer: BLUE CROSS/BLUE SHIELD | Attending: Family Medicine | Admitting: Physical Therapy

## 2022-05-17 ENCOUNTER — Telehealth: Payer: Self-pay | Admitting: Family Medicine

## 2022-05-17 ENCOUNTER — Telehealth: Payer: Self-pay | Admitting: *Deleted

## 2022-05-17 DIAGNOSIS — N760 Acute vaginitis: Secondary | ICD-10-CM

## 2022-05-17 MED ORDER — METRONIDAZOLE 0.75 % VA GEL: 1.0000 | Freq: Every day | VAGINAL | 3 refills | Status: DC

## 2022-05-17 NOTE — Addendum Note (Signed)
Addended by: Leeanne Rio on: 05/17/2022 04:39 PM   Modules accepted: Orders

## 2022-05-17 NOTE — Telephone Encounter (Signed)
Called patient, see other phone encounter for more details Leeanne Rio, MD

## 2022-05-17 NOTE — Telephone Encounter (Signed)
Called patient.   Clarified the issue - the metrogel that was sent in was a 1.3% gel, which is typically given once.  She reports ongoing discharge, malodorous and thin. A little bit of itching but more runny and smelly than thick/itchy. Sounds more consistent with BV.  Will send in the 0.75% gel, which is given nightly for 5 days.  Advised since she already has it, she can use the 1.3% gel once. If that does not improve her symptoms, she can then switch to the 0.75% gel nightly for 5 days.   Once her symptoms are improved she will go to the 0.75% gel twice a week for suppressive therapy for BV.  She will continue the suppressive therapy with diflucan 150mg  weekly.  She also requests referral to gynecology for recurrent BV and yeast, which is reasonable. Referral entered.  Leeanne Rio, MD

## 2022-05-17 NOTE — Telephone Encounter (Signed)
Patient called stating that she was seen on 04/26/22 for vaginal discharge and was hadd a prescription called in. She was told to take it for 5 days, but the prescription was only for 1 day. Asks if her doctor could please give her a call.

## 2022-05-17 NOTE — Telephone Encounter (Signed)
[facility] calls.  The miconazole med is typically a 5 gram dose for one dose (not 5 days).  Please call [facility] to provide correct medication dosage and usage. Christen Bame, CMA

## 2022-05-20 ENCOUNTER — Telehealth: Payer: Self-pay | Admitting: Pharmacist

## 2022-05-20 NOTE — Telephone Encounter (Signed)
Clarified prescription for metronidazole - confirmed order as previously ordered 05/17/2022 by Dr. Ardelia Mems was IN ADDITION to the previously prescribed higher dose.   Pharmacist will dispense.

## 2022-05-25 ENCOUNTER — Other Ambulatory Visit: Payer: Self-pay | Admitting: Student

## 2022-05-25 ENCOUNTER — Telehealth: Payer: Self-pay

## 2022-05-25 DIAGNOSIS — B3731 Acute candidiasis of vulva and vagina: Secondary | ICD-10-CM

## 2022-05-25 NOTE — Telephone Encounter (Signed)
Patient contacted.   Patient advised medication was sent in and directions given.

## 2022-05-25 NOTE — Telephone Encounter (Signed)
Sent in rx for the 1.3%. Please inform patient, and make sure she knows it is a ONE time dose.  Thanks Leeanne Rio, MD

## 2022-05-25 NOTE — Telephone Encounter (Signed)
Patient calls nurse line in regards to continued BV symptoms.   She reports she has been using metro gel 0.75%, however reports her symptoms are not improving.   She is requesting a refill on metro gel 1.3%. She reports she threw this medication away when she was told the 0.75% would be called in.   Patient does have an apt with GYN on 05/31/2022.  Will forward to PCP for advisement.

## 2022-05-31 ENCOUNTER — Encounter: Payer: Self-pay | Admitting: Nurse Practitioner

## 2022-05-31 ENCOUNTER — Ambulatory Visit (INDEPENDENT_AMBULATORY_CARE_PROVIDER_SITE_OTHER): Payer: BLUE CROSS/BLUE SHIELD | Admitting: Nurse Practitioner

## 2022-05-31 VITALS — BP 112/78 | HR 93 | Ht 61.0 in | Wt 109.0 lb

## 2022-05-31 DIAGNOSIS — R350 Frequency of micturition: Secondary | ICD-10-CM | POA: Diagnosis not present

## 2022-05-31 DIAGNOSIS — B3731 Acute candidiasis of vulva and vagina: Secondary | ICD-10-CM

## 2022-05-31 DIAGNOSIS — N898 Other specified noninflammatory disorders of vagina: Secondary | ICD-10-CM

## 2022-05-31 DIAGNOSIS — N760 Acute vaginitis: Secondary | ICD-10-CM | POA: Diagnosis not present

## 2022-05-31 LAB — WET PREP FOR TRICH, YEAST, CLUE

## 2022-05-31 MED ORDER — TERCONAZOLE 0.8 % VA CREA: 1.0000 | TOPICAL_CREAM | Freq: Every day | VAGINAL | 2 refills | Status: DC

## 2022-05-31 NOTE — Progress Notes (Signed)
   Acute Office Visit  Subjective:    Patient ID: [name], female    DOB: 11/21/2000, 22 y.o.   MRN: [id number]   HPI 22 y.o. G0 presents as new patient for recurrent vaginitis. Today she complains of urinary frequency (x 2 weeks), white discharge and vulvovaginal irritation x a couple of months. Feels symptoms are yeast-related today. Significant history of BV and yeast infections. +Yeast/BV confirmed by wet [facility] - 05/2021, 09/2021, 02/2022, 03/2022, and 04/2022. Other yeast and BV infections alone in between those. Yeast culture 02/2022 + Albicans. Has been prescribed Flagyl, Metrogel, and Nuvessa for BV treatment in the past. Maebelle Munroe most recently and she felt it worked. Has taken multiple Diflucan but feels it has not helped with yeast symptoms at all. Has not been sexually active in a while due to infections. Has changed soaps to sensitive or non-scented. Was taking AZO probiotics but stopped use. She does feel it could be related to her menses.    Review of Systems  Constitutional: Negative.   Genitourinary:  Positive for frequency, vaginal discharge and vaginal pain (Itching/irritation). Negative for dysuria, hematuria and urgency.       Objective:    Physical Exam Constitutional:      Appearance: Normal appearance.  Genitourinary:    General: Normal vulva.     Vagina: Vaginal discharge and erythema present.     Cervix: Normal.     BP 112/78   Pulse 93   Ht 5\' 1"  (1.549 m) Comment: pt reports  Wt 109 lb (49.4 kg)   LMP 05/09/2022 (Approximate)   SpO2 99%   BMI 20.60 kg/m  Wt Readings from Last 3 Encounters:  05/31/22 109 lb (49.4 kg)  05/07/22 110 lb 6 oz (50.1 kg)  04/28/22 109 lb (49.4 kg)        Patient informed chaperone available to be present for breast and/or pelvic exam. Patient has requested no chaperone to be present. Patient has been advised what will be completed during breast and pelvic exam.   UA: 3+ leukocytes, negative nitrites, 1+ protein,  dark yellow/cloudy. Microscopic: wbc 10-20, rbc 0-2, many bacteria, many yeast Wet prep + yeast (budding, pseudohyphae noted)  Assessment & Plan:   Problem List Items Addressed This Visit   None Visit Diagnoses     Vulvovaginal candidiasis    -  Primary   Relevant Medications   terconazole (TERAZOL 3) 0.8 % vaginal cream   Recurrent vaginitis       Urinary frequency       Relevant Orders   Urinalysis,Complete w/RFL Culture (Completed)   Vaginal discharge       Relevant Orders   WET PREP FOR [place], YEAST, CLUE (Completed)      Plan: Wet prep positive for yeast - Terazol 0.8% x 3 nights, refills provided. If symptoms return, suppressive treatment recommended. Monitor if symptoms occur after menses. Start women's health probiotic, avoid soaps/body washes with scent or harsh chemicals, boric acid suppository vaginally twice weekly. Leukocytosis in urine could be from vaginal infection, will wait on culture.      Tamela Gammon DNP, 2:51 PM 05/31/2022

## 2022-06-02 LAB — URINALYSIS, COMPLETE W/RFL CULTURE
Bilirubin Urine: NEGATIVE
Glucose, UA: NEGATIVE
Hyaline Cast: NONE SEEN /LPF
Nitrites, Initial: NEGATIVE
Specific Gravity, Urine: 1.027 (ref 1.001–1.035)
pH: 5.5 (ref 5.0–8.0)

## 2022-06-02 LAB — CULTURE INDICATED

## 2022-06-02 LAB — URINE CULTURE
MICRO NUMBER:: 14454795
Result:: NO GROWTH
SPECIMEN QUALITY:: ADEQUATE

## 2022-06-04 ENCOUNTER — Encounter: Payer: Self-pay | Admitting: Student

## 2022-06-04 ENCOUNTER — Ambulatory Visit (INDEPENDENT_AMBULATORY_CARE_PROVIDER_SITE_OTHER): Payer: BLUE CROSS/BLUE SHIELD | Admitting: Student

## 2022-06-04 VITALS — BP 104/78 | HR 93 | Wt 111.2 lb

## 2022-06-04 DIAGNOSIS — M545 Low back pain, unspecified: Secondary | ICD-10-CM

## 2022-06-04 DIAGNOSIS — G8929 Other chronic pain: Secondary | ICD-10-CM
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
## 2022-06-04 NOTE — Assessment & Plan Note (Addendum)
The nature of her back pain is more consistent with musculoskeletal in the setting of muscle strain.  No red flag symptoms, denies incontinence or saddle paresthesia. Exam was unremarkable with 5/5 muscle strength on all extremities with sensation intact.  -Recommend use of lidocaine patch or Voltaren gel -Can use naproxen at least 30 minutes before work PRN -Continue baclofen in as needed -Provided patient with work note to keep patient on light duty untill she has completed physical therapy to allow enough for therapy to be effective.

## 2022-06-04 NOTE — Patient Instructions (Addendum)
It was wonderful to see you today. Thank you for allowing me to be a part of your care. Below is a short summary of what we discussed at your visit today:  Please your back pain is likely due to muscle strain given the type of work you do.  I recommend you continue the muscle relaxer as needed.  You can also do naproxen as an 30 minutes before work to make sure you are not in pain during work.  It's important that you continue your physical therapy.  You can also try over-the-counter Voltaren gel which you apply at the area  I have provided you with a letter for work to only have you on light duty activities.  Please bring all of your medications to every appointment!  If you have any questions or concerns, please do not hesitate to contact us via phone or MyChart message.   Alen Bleacher, MD [facility]

## 2022-06-10 DIAGNOSIS — Z419 Encounter for procedure for purposes other than remedying health state, unspecified: Secondary | ICD-10-CM | POA: Diagnosis not present

## 2022-06-14 ENCOUNTER — Encounter: Payer: Self-pay | Admitting: Student

## 2022-06-17 ENCOUNTER — Encounter: Payer: Self-pay | Admitting: Student

## 2022-06-17 NOTE — Telephone Encounter (Signed)
Patient called stating she is no longer needing FMLA paperwork filled out. She is now just requesting a letter stating she can return to work. Patient would like a phone call when letter is completed.   Please advise.  Thank you!

## 2022-06-25 ENCOUNTER — Telehealth: Payer: Self-pay | Admitting: Family Medicine

## 2022-06-25 ENCOUNTER — Encounter: Payer: Self-pay | Admitting: Family Medicine

## 2022-06-25 NOTE — Telephone Encounter (Signed)
Called and spoke with patient She is feeling better and is ready to return to work without restrictions Letter written, will place at front desk.  Leeanne Rio, MD

## 2022-06-25 NOTE — Telephone Encounter (Signed)
Patient came stating that she needs a letter for work stating that she is able to return to work tomorrow, 06/26/22 and has no restrictions.

## 2022-07-09 DIAGNOSIS — Z419 Encounter for procedure for purposes other than remedying health state, unspecified: Secondary | ICD-10-CM | POA: Diagnosis not present

## 2022-07-29 ENCOUNTER — Ambulatory Visit (INDEPENDENT_AMBULATORY_CARE_PROVIDER_SITE_OTHER): Payer: Medicaid Other | Admitting: Student

## 2022-07-29 DIAGNOSIS — Z91199 Patient's noncompliance with other medical treatment and regimen due to unspecified reason: Secondary | ICD-10-CM

## 2022-07-29 NOTE — Progress Notes (Deleted)
  SUBJECTIVE:   CHIEF COMPLAINT / HPI:   ***  PERTINENT  PMH / PSH: ***  Past Medical History:  Diagnosis Date   Screen for STD (sexually transmitted disease) 02/28/2017   Seasonal allergies    Vaginal bleeding 09/17/2016   Vulvovaginal candidiasis 11/15/2014    Patient Care Team: Leeanne Rio, MD as PCP - General (Family Medicine) OBJECTIVE:  There were no vitals taken for this visit. Physical Exam   ASSESSMENT/PLAN:  There are no diagnoses linked to this encounter. No follow-ups on file. Wells Guiles, DO 07/29/2022, 9:08 AM PGY-***, [facility] {    This will disappear when note is signed, click to select method of visit    :1}

## 2022-07-30 NOTE — Progress Notes (Signed)
No show

## 2022-08-09 DIAGNOSIS — Z419 Encounter for procedure for purposes other than remedying health state, unspecified: Secondary | ICD-10-CM | POA: Diagnosis not present

## 2022-08-27 ENCOUNTER — Ambulatory Visit (INDEPENDENT_AMBULATORY_CARE_PROVIDER_SITE_OTHER): Payer: Medicaid Other | Admitting: Family Medicine

## 2022-08-27 ENCOUNTER — Other Ambulatory Visit (HOSPITAL_COMMUNITY)
Admission: RE | Admit: 2022-08-27 | Discharge: 2022-08-27 | Disposition: A | Discharge status: Home / Self Care | Payer: Medicaid Other | Source: Ambulatory Visit | Attending: Family Medicine | Admitting: Family Medicine

## 2022-08-27 VITALS — BP 108/55 | HR 75 | Wt 115.8 lb

## 2022-08-27 DIAGNOSIS — B3731 Acute candidiasis of vulva and vagina: Secondary | ICD-10-CM

## 2022-08-27 DIAGNOSIS — Z113 Encounter for screening for infections with a predominantly sexual mode of transmission: Secondary | ICD-10-CM | POA: Diagnosis not present

## 2022-08-27 LAB — POCT WET PREP (WET MOUNT)
Clue Cells Wet Prep Whiff POC: NEGATIVE
Trichomonas Wet Prep HPF POC: ABSENT

## 2022-08-27 NOTE — Patient Instructions (Addendum)
It was great to see you!  Your testing was negative for BV or yeast today.   I will send you a MyChart message in a few days (or call if needed) with the results of your STI testing.  Take care, Dr Anner Crete

## 2022-08-27 NOTE — Progress Notes (Unsigned)
    SUBJECTIVE:   CHIEF COMPLAINT / HPI:   STI Testing -vaginal discharge a few days ago but this resolved -now with slight vaginal odor -no urinary symptoms, fever, or other complaints -would like routine STI testing -no known STI exposures although does have new partner since she was checked last -declines blood work for HIV, RPR -  PERTINENT  PMH / PSH:   OBJECTIVE:   BP (!) 108/55   Pulse 75   Wt 115 lb 12.8 oz (52.5 kg)   LMP 08/01/2022 (Exact Date)   SpO2 100%   BMI 21.88 kg/m   ***  ASSESSMENT/PLAN:   No problem-specific Assessment & Plan notes found for this encounter.     Maury Dus, MD [facility]

## 2022-08-28 NOTE — Assessment & Plan Note (Signed)
Wet prep unremarkable today. Await results of gc/chlamydia testing. Patient declined blood work for HIV, RPR today. Counseled on safe sex practices including barrier contraception.

## 2022-08-29 ENCOUNTER — Encounter (HOSPITAL_BASED_OUTPATIENT_CLINIC_OR_DEPARTMENT_OTHER): Payer: Self-pay

## 2022-08-29 ENCOUNTER — Other Ambulatory Visit: Payer: Self-pay

## 2022-08-29 ENCOUNTER — Emergency Department (HOSPITAL_BASED_OUTPATIENT_CLINIC_OR_DEPARTMENT_OTHER)
Admission: EM | Admit: 2022-08-29 | Discharge: 2022-08-29 | Disposition: A | Discharge status: Home / Self Care | Payer: Medicaid Other | Attending: Emergency Medicine | Admitting: Emergency Medicine

## 2022-08-29 ENCOUNTER — Emergency Department (HOSPITAL_BASED_OUTPATIENT_CLINIC_OR_DEPARTMENT_OTHER): Payer: Medicaid Other

## 2022-08-29 DIAGNOSIS — S0990XA Unspecified injury of head, initial encounter: Secondary | ICD-10-CM | POA: Diagnosis not present

## 2022-08-29 DIAGNOSIS — W231XXA Caught, crushed, jammed, or pinched between stationary objects, initial encounter: Secondary | ICD-10-CM | POA: Diagnosis not present
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
## 2022-08-31 ENCOUNTER — Telehealth: Payer: Self-pay

## 2022-08-31 DIAGNOSIS — B3731 Acute candidiasis of vulva and vagina: Secondary | ICD-10-CM

## 2022-08-31 LAB — CERVICOVAGINAL ANCILLARY ONLY
Bacterial Vaginitis (gardnerella): POSITIVE — AB
Candida Glabrata: NEGATIVE
Candida Vaginitis: POSITIVE — AB
Chlamydia: NEGATIVE
Comment: NEGATIVE
Comment: NEGATIVE
Comment: NEGATIVE
Comment: NEGATIVE
Comment: NEGATIVE
Comment: NORMAL
Neisseria Gonorrhea: NEGATIVE
Trichomonas: NEGATIVE

## 2022-08-31 MED ORDER — METRONIDAZOLE 500 MG PO TABS: 500.0000 mg | ORAL_TABLET | Freq: Two times a day (BID) | ORAL | 0 refills | Status: DC

## 2022-08-31 MED ORDER — TERCONAZOLE 0.8 % VA CREA: 1.0000 | TOPICAL_CREAM | Freq: Every day | VAGINAL | 0 refills | Status: AC

## 2022-08-31 NOTE — Telephone Encounter (Signed)
  Rx sent for Terconazole cream x3 days for yeast and Flagyl  BID x7 days for BV.   Maury Dus, MD PGY-3, [facility]

## 2022-08-31 NOTE — Telephone Encounter (Signed)
Patient calls nurse line requesting test results.   Patient advised of positive BV and yeast. She reports vaginal discharge and odor.   She is requesting Terazol 3 (on medication list) for yeast and Flagyl for BV.   Discussed negative STI testing.   Will forward to provider who saw patient.

## 2022-09-03 ENCOUNTER — Ambulatory Visit (INDEPENDENT_AMBULATORY_CARE_PROVIDER_SITE_OTHER): Payer: Medicaid Other | Admitting: Family Medicine

## 2022-09-03 VITALS — BP 99/60 | HR 83 | Ht 61.0 in | Wt 115.5 lb

## 2022-09-03 DIAGNOSIS — S060X0D Concussion without loss of consciousness, subsequent encounter: Secondary | ICD-10-CM | POA: Diagnosis not present

## 2022-09-03 MED ORDER — ONDANSETRON HCL 4 MG PO TABS: 4.0000 mg | ORAL_TABLET | Freq: Three times a day (TID) | ORAL | 0 refills | Status: DC | PRN

## 2022-09-03 MED ORDER — TIZANIDINE HCL 2 MG PO TABS: 2.0000 mg | ORAL_TABLET | Freq: Four times a day (QID) | ORAL | 0 refills | Status: DC | PRN

## 2022-09-03 NOTE — Patient Instructions (Addendum)
It was nice seeing you today!  Take Zofran as needed for nausea.  Try the Tylenol and ibuprofen together. You can try the tizanidine as needed as well.  Stay well, Littie Deeds, MD [facility] [phone]  --  Make sure to check out at the front desk before you leave today.  Please arrive at least 15 minutes prior to your scheduled appointments.  If you had blood work today, I will send you a MyChart message or a letter if results are normal. Otherwise, I will give you a call.  If you had a referral placed, they will call you to set up an appointment. Please give [name] a call if you don't hear back in the next 2 weeks.  If you need additional refills before your next appointment, please call your pharmacy first.

## 2022-09-03 NOTE — Progress Notes (Signed)
    SUBJECTIVE:   CHIEF COMPLAINT / HPI:  No chief complaint on file.   ***  PERTINENT  PMH / PSH: ***  Patient Care Team: Latrelle Dodrill, MD as PCP - General (Family Medicine)   OBJECTIVE:   BP 99/60   Pulse 83   Ht 5\' 1"  (1.549 m)   Wt 115 lb 8 oz (52.4 kg)   LMP 08/29/2022 (Exact Date)   SpO2 100%   BMI 21.82 kg/m   Physical Exam      05/07/2022   10:06 AM  Depression screen PHQ 2/9  Decreased Interest 0  Down, Depressed, Hopeless 0  PHQ - 2 Score 0  Altered sleeping 0  Tired, decreased energy 0  Change in appetite 0  Feeling bad or failure about yourself  0  Trouble concentrating 0  Moving slowly or fidgety/restless 0  Suicidal thoughts 0  PHQ-9 Score 0  Difficult doing work/chores Not difficult at all     {Show previous vital signs (optional):23777}  {Labs  Heme  Chem  Endocrine  Serology  Results Review (optional):23779}  ASSESSMENT/PLAN:   No problem-specific Assessment & Plan notes found for this encounter.    No follow-ups on file.   Littie Deeds, MD [facility]

## 2022-09-08 DIAGNOSIS — Z419 Encounter for procedure for purposes other than remedying health state, unspecified: Secondary | ICD-10-CM | POA: Diagnosis not present

## 2022-09-10 ENCOUNTER — Emergency Department (HOSPITAL_BASED_OUTPATIENT_CLINIC_OR_DEPARTMENT_OTHER)
Admission: EM | Admit: 2022-09-10 | Discharge: 2022-09-10 | Disposition: A | Discharge status: Home / Self Care | Payer: Medicaid Other | Attending: Emergency Medicine | Admitting: Emergency Medicine

## 2022-09-10 ENCOUNTER — Other Ambulatory Visit: Payer: Self-pay

## 2022-09-10 DIAGNOSIS — S0990XA Unspecified injury of head, initial encounter: Secondary | ICD-10-CM | POA: Diagnosis present

## 2022-09-10 DIAGNOSIS — S060X0A Concussion without loss of consciousness, initial encounter: Secondary | ICD-10-CM | POA: Insufficient documentation

## 2022-09-10 DIAGNOSIS — W228XXA Striking against or struck by other objects, initial encounter: Secondary | ICD-10-CM | POA: Diagnosis not present

## 2022-09-10 DIAGNOSIS — Y990 Civilian activity done for income or pay: Secondary | ICD-10-CM | POA: Diagnosis not present

## 2022-09-10 MED ORDER — DEXAMETHASONE 4 MG PO TABS
10.0000 mg | ORAL_TABLET | Freq: Once | ORAL | Status: AC
  Administered 2022-09-10: 10 mg via ORAL
  Filled 2022-09-10: qty 3

## 2022-09-10 MED ORDER — PROCHLORPERAZINE EDISYLATE 10 MG/2ML IJ SOLN
10.0000 mg | Freq: Once | INTRAMUSCULAR | Status: AC
  Administered 2022-09-10: 10 mg via INTRAMUSCULAR
  Filled 2022-09-10: qty 2

## 2022-09-10 MED ORDER — DIPHENHYDRAMINE HCL 50 MG/ML IJ SOLN
25.0000 mg | Freq: Once | INTRAMUSCULAR | Status: AC
  Administered 2022-09-10: 25 mg via INTRAMUSCULAR
  Filled 2022-09-10: qty 1
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
## 2022-09-10 NOTE — Discharge Instructions (Signed)
Follow up with the concussion clinic in the  office.
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
## 2022-09-18 ENCOUNTER — Emergency Department (HOSPITAL_BASED_OUTPATIENT_CLINIC_OR_DEPARTMENT_OTHER): Payer: Medicaid Other

## 2022-09-18 ENCOUNTER — Other Ambulatory Visit: Payer: Self-pay

## 2022-09-18 ENCOUNTER — Emergency Department (HOSPITAL_BASED_OUTPATIENT_CLINIC_OR_DEPARTMENT_OTHER)
Admission: EM | Admit: 2022-09-18 | Discharge: 2022-09-18 | Disposition: A | Discharge status: Home / Self Care | Payer: Medicaid Other | Attending: Emergency Medicine | Admitting: Emergency Medicine

## 2022-09-18 DIAGNOSIS — S060X0D Concussion without loss of consciousness, subsequent encounter: Secondary | ICD-10-CM | POA: Diagnosis not present

## 2022-09-18 DIAGNOSIS — F4310 Post-traumatic stress disorder, unspecified: Secondary | ICD-10-CM | POA: Insufficient documentation

## 2022-09-18 DIAGNOSIS — W228XXD Striking against or struck by other objects, subsequent encounter: Secondary | ICD-10-CM | POA: Diagnosis not present

## 2022-09-18 DIAGNOSIS — S060XAD Concussion with loss of consciousness status unknown, subsequent encounter: Secondary | ICD-10-CM | POA: Diagnosis not present

## 2022-09-18 DIAGNOSIS — R519 Headache, unspecified: Secondary | ICD-10-CM | POA: Diagnosis not present

## 2022-09-18 MED ORDER — KETOROLAC TROMETHAMINE 15 MG/ML IJ SOLN
15.0000 mg | Freq: Once | INTRAMUSCULAR | Status: AC
  Administered 2022-09-18: 15 mg via INTRAVENOUS
  Filled 2022-09-18: qty 1

## 2022-09-18 MED ORDER — PROCHLORPERAZINE EDISYLATE 10 MG/2ML IJ SOLN
5.0000 mg | Freq: Once | INTRAMUSCULAR | Status: AC
  Administered 2022-09-18: 5 mg via INTRAVENOUS
  Filled 2022-09-18: qty 2

## 2022-09-18 NOTE — ED Notes (Signed)
Pt resting with lights off, call light in reach.
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
## 2022-09-18 NOTE — ED Notes (Signed)
Pt received AVS: Advised to follow up with PCP or return if symptoms worsen. Pt verbalized understanding and had no further questions upon discharge.

## 2022-09-18 NOTE — Discharge Instructions (Signed)
You have continue to have the headaches which potentially could be due to the head injury.  Follow-up with your doctor.  Your head CT today was reassuring again.
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
## 2022-10-09 DIAGNOSIS — Z419 Encounter for procedure for purposes other than remedying health state, unspecified: Secondary | ICD-10-CM | POA: Diagnosis not present

## 2022-10-15 ENCOUNTER — Ambulatory Visit (INDEPENDENT_AMBULATORY_CARE_PROVIDER_SITE_OTHER): Payer: Medicaid Other | Admitting: Student

## 2022-10-15 ENCOUNTER — Other Ambulatory Visit (HOSPITAL_COMMUNITY)
Admission: RE | Admit: 2022-10-15 | Discharge: 2022-10-15 | Disposition: A | Discharge status: Home / Self Care | Payer: Medicaid Other | Source: Ambulatory Visit | Attending: Family Medicine | Admitting: Family Medicine

## 2022-10-15 VITALS — BP 101/71 | HR 87 | Ht 61.0 in | Wt 115.2 lb

## 2022-10-15 DIAGNOSIS — Z113 Encounter for screening for infections with a predominantly sexual mode of transmission: Secondary | ICD-10-CM | POA: Insufficient documentation

## 2022-10-15 LAB — POCT WET PREP (WET MOUNT)
Clue Cells Wet Prep Whiff POC: NEGATIVE
Trichomonas Wet Prep HPF POC: ABSENT
WBC, Wet Prep HPF POC: NONE SEEN

## 2022-10-15 NOTE — Progress Notes (Signed)
    SUBJECTIVE:   CHIEF COMPLAINT / HPI:   22 y.o. female presents for routine STD testing.  She is sexually active and inconsistent with condom use. Denies vaginal discharge, itchiness or odor.  No hematuria or recent change in medication Reports occasional option of rash on her thighs that are usually pruritic and in clusters.  PERTINENT  PMH / PSH: Reviewed   OBJECTIVE:   BP 101/71   Pulse 87   Ht 5\' 1"  (1.549 m)   Wt 115 lb 4 oz (52.3 kg)   LMP 09/26/2022   SpO2 100%   BMI 21.78 kg/m    Physical Exam General: Alert, well appearing, NAD Cardiovascular: RRR, No Murmurs, Normal S2/S2 Respiratory: CTAB, No wheezing or Rales Genitalia:  Normal introitus for age, no external lesions, white vaginal discharge , mucosa pink and moist, no vaginal or cervical lesions, no vaginal atrophy, no friaility or hemorrhage, normal uterus size     ASSESSMENT/PLAN:   STD screening Sexually active female who is inconsistent with protection use.  Obtaining lab f chlamydia, gonorrhea and components.  Declines HIV and syphilis test.  Her exam was unremarkable other than white discharge. No rash present on the thigh or pelvic area. -In lab for chlamydia, gonorrhea and trichomonas -Patient declined HIV, RPR -Discussed need for birth control with patient.  Jerre Simon, MD [facility]

## 2022-10-15 NOTE — Patient Instructions (Signed)
It was wonderful to see you today. Thank you for allowing me to be a part of your care. Below is a short summary of what we discussed at your visit today:  Today we collected lab for STD testing which include chlamydia, gonorrhea and trichomonas.  Will call you if you have abnormal results..  If it is negative you should receive a MyChart message with your results.  Please bring all of your medications to every appointment!  If you have any questions or concerns, please do not hesitate to contact us via phone or MyChart message.   Jerre Simon, MD [facility]

## 2022-10-18 LAB — CERVICOVAGINAL ANCILLARY ONLY
Chlamydia: NEGATIVE
Comment: NEGATIVE
Comment: NEGATIVE
Comment: NORMAL
Neisseria Gonorrhea: NEGATIVE
Trichomonas: NEGATIVE

## 2022-10-23 ENCOUNTER — Other Ambulatory Visit: Payer: Self-pay

## 2022-10-23 ENCOUNTER — Emergency Department (HOSPITAL_BASED_OUTPATIENT_CLINIC_OR_DEPARTMENT_OTHER)
Admission: EM | Admit: 2022-10-23 | Discharge: 2022-10-23 | Disposition: A | Discharge status: Home / Self Care | Payer: Medicaid Other | Attending: Emergency Medicine | Admitting: Emergency Medicine

## 2022-10-23 ENCOUNTER — Encounter (HOSPITAL_BASED_OUTPATIENT_CLINIC_OR_DEPARTMENT_OTHER): Payer: Self-pay | Admitting: Emergency Medicine

## 2022-10-23 DIAGNOSIS — R519 Headache, unspecified: Secondary | ICD-10-CM | POA: Insufficient documentation

## 2022-10-23 DIAGNOSIS — F0781 Postconcussional syndrome: Secondary | ICD-10-CM

## 2022-10-23 MED ORDER — BUTALBITAL-APAP-CAFFEINE 50-325-40 MG PO TABS: 1.0000 | ORAL_TABLET | Freq: Four times a day (QID) | ORAL | 0 refills | Status: DC | PRN
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
## 2022-10-23 NOTE — ED Notes (Signed)
Pt received AVS; educated on pain management; verbalized understanding of MD instructions and had no further questions upon d/c.

## 2022-10-30 ENCOUNTER — Emergency Department (HOSPITAL_BASED_OUTPATIENT_CLINIC_OR_DEPARTMENT_OTHER)
Admission: EM | Admit: 2022-10-30 | Discharge: 2022-10-30 | Disposition: A | Discharge status: Home / Self Care | Payer: Medicaid Other | Attending: Emergency Medicine | Admitting: Emergency Medicine

## 2022-10-30 ENCOUNTER — Emergency Department (HOSPITAL_BASED_OUTPATIENT_CLINIC_OR_DEPARTMENT_OTHER): Payer: Medicaid Other

## 2022-10-30 ENCOUNTER — Encounter (HOSPITAL_BASED_OUTPATIENT_CLINIC_OR_DEPARTMENT_OTHER): Payer: Self-pay

## 2022-10-30 DIAGNOSIS — G8929 Other chronic pain: Secondary | ICD-10-CM

## 2022-10-30 DIAGNOSIS — R519 Headache, unspecified: Secondary | ICD-10-CM | POA: Insufficient documentation

## 2022-10-30 MED ORDER — KETOROLAC TROMETHAMINE 30 MG/ML IJ SOLN
30.0000 mg | Freq: Once | INTRAMUSCULAR | Status: AC
  Administered 2022-10-30: 30 mg via INTRAMUSCULAR
  Filled 2022-10-30: qty 1
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
## 2022-10-30 NOTE — ED Notes (Signed)
Dc instructions reviewed with patient. Patient voiced understanding. Dc with belongings. Gave pt the number to the concussion clinic that was given to her on 5/3. Pt states her workman's comp rep told her not to go, they were going to refer her to a neurologist . She has not received any further communication from [employer] comp

## 2022-11-08 DIAGNOSIS — Z419 Encounter for procedure for purposes other than remedying health state, unspecified: Secondary | ICD-10-CM | POA: Diagnosis not present

## 2022-11-15 ENCOUNTER — Ambulatory Visit (INDEPENDENT_AMBULATORY_CARE_PROVIDER_SITE_OTHER): Payer: Medicaid Other | Admitting: Family Medicine

## 2022-11-15 ENCOUNTER — Encounter: Payer: Self-pay | Admitting: Family Medicine

## 2022-11-15 ENCOUNTER — Other Ambulatory Visit (HOSPITAL_COMMUNITY)
Admission: RE | Admit: 2022-11-15 | Discharge: 2022-11-15 | Disposition: A | Discharge status: Home / Self Care | Payer: Medicaid Other | Source: Ambulatory Visit | Attending: Family Medicine | Admitting: Family Medicine

## 2022-11-15 ENCOUNTER — Other Ambulatory Visit: Payer: Self-pay

## 2022-11-15 VITALS — BP 106/72 | HR 95 | Ht 61.0 in | Wt 115.6 lb

## 2022-11-15 DIAGNOSIS — Z113 Encounter for screening for infections with a predominantly sexual mode of transmission: Secondary | ICD-10-CM

## 2022-11-15 DIAGNOSIS — N760 Acute vaginitis: Secondary | ICD-10-CM

## 2022-11-15 DIAGNOSIS — R350 Frequency of micturition: Secondary | ICD-10-CM

## 2022-11-15 LAB — POCT URINALYSIS DIP (MANUAL ENTRY)
Bilirubin, UA: NEGATIVE
Blood, UA: NEGATIVE
Glucose, UA: NEGATIVE mg/dL
Ketones, POC UA: NEGATIVE mg/dL
Leukocytes, UA: NEGATIVE
Nitrite, UA: NEGATIVE
Protein Ur, POC: NEGATIVE mg/dL
Spec Grav, UA: >=1.03 — AB (ref 1.010–1.025)
Urobilinogen, UA: 0.2 E.U./dL
pH, UA: 5.5 (ref 5.0–8.0)

## 2022-11-15 LAB — POCT WET PREP (WET MOUNT)
Clue Cells Wet Prep Whiff POC: POSITIVE
Trichomonas Wet Prep HPF POC: ABSENT
WBC, Wet Prep HPF POC: NONE SEEN

## 2022-11-15 MED ORDER — METRONIDAZOLE 0.75 % VA GEL
1.0000 | Freq: Every day | VAGINAL | 3 refills | Status: DC
Start: 2022-11-15 — End: 2022-12-08

## 2022-11-15 NOTE — Patient Instructions (Signed)
It was great to see you again today.  Checking for infections Letter written for work  Be well, Dr. Pollie Meyer
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
## 2022-11-16 LAB — CERVICOVAGINAL ANCILLARY ONLY
Bacterial Vaginitis (gardnerella): POSITIVE — AB
Candida Glabrata: NEGATIVE
Candida Vaginitis: NEGATIVE
Chlamydia: NEGATIVE
Comment: NEGATIVE
Comment: NEGATIVE
Comment: NEGATIVE
Comment: NEGATIVE
Comment: NEGATIVE
Comment: NORMAL
Neisseria Gonorrhea: NEGATIVE
Trichomonas: NEGATIVE

## 2022-12-06 ENCOUNTER — Other Ambulatory Visit: Payer: Self-pay

## 2022-12-06 ENCOUNTER — Encounter (HOSPITAL_BASED_OUTPATIENT_CLINIC_OR_DEPARTMENT_OTHER): Payer: Self-pay

## 2022-12-06 ENCOUNTER — Emergency Department (HOSPITAL_BASED_OUTPATIENT_CLINIC_OR_DEPARTMENT_OTHER)
Admission: EM | Admit: 2022-12-06 | Discharge: 2022-12-06 | Disposition: A | Discharge status: Home / Self Care | Payer: Medicaid Other | Attending: Emergency Medicine | Admitting: Emergency Medicine

## 2022-12-06 DIAGNOSIS — Y92410 Unspecified street and highway as the place of occurrence of the external cause: Secondary | ICD-10-CM | POA: Insufficient documentation

## 2022-12-06 DIAGNOSIS — M549 Dorsalgia, unspecified: Secondary | ICD-10-CM | POA: Diagnosis not present

## 2022-12-06 DIAGNOSIS — M546 Pain in thoracic spine: Secondary | ICD-10-CM | POA: Diagnosis not present

## 2022-12-06 MED ORDER — LIDOCAINE 5 % EX PTCH: 1.0000 | MEDICATED_PATCH | CUTANEOUS | 0 refills | Status: DC

## 2022-12-06 MED ORDER — METHOCARBAMOL 500 MG PO TABS: 500.0000 mg | ORAL_TABLET | Freq: Two times a day (BID) | ORAL | 0 refills | Status: DC

## 2022-12-06 MED ORDER — LIDOCAINE 5 % EX PTCH
1.0000 | MEDICATED_PATCH | Freq: Once | CUTANEOUS | Status: DC
  Administered 2022-12-06: 1 via TRANSDERMAL
  Filled 2022-12-06: qty 1
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
## 2022-12-06 NOTE — Discharge Instructions (Addendum)
It was a pleasure taking care of you today!  You will feel more sore in the morning. You are prescribed Robaxin (muscle relaxer). Do not drive or operate heavy machinery while taking the muscle relaxer as this medication can make you sleepy/drowsy.  You will be sent a prescription for Lidoderm patch, remove and replace with a new patch every 12 hours.  You may take over the counter 600 mg Ibuprofen every 6 hours and alternate with 500 mg Tylenol every 6 hours as needed for pain for no more than 7 days. You may apply ice or heat to affected area for up to 15 minutes at a time. Ensure to place a barrier between your skin and the ice/heat.  Follow-up with your primary care provider regarding today's ED visit.  Return to the Emergency Department if you are experiencing increasing/worsening symptoms.
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
## 2022-12-08 ENCOUNTER — Ambulatory Visit (INDEPENDENT_AMBULATORY_CARE_PROVIDER_SITE_OTHER): Payer: Medicaid Other | Admitting: Nurse Practitioner

## 2022-12-08 ENCOUNTER — Encounter: Payer: Self-pay | Admitting: Nurse Practitioner

## 2022-12-08 VITALS — BP 114/62 | HR 79 | Temp 98.5°F | Wt 114.0 lb

## 2022-12-08 DIAGNOSIS — R350 Frequency of micturition: Secondary | ICD-10-CM

## 2022-12-08 DIAGNOSIS — R1030 Lower abdominal pain, unspecified: Secondary | ICD-10-CM | POA: Diagnosis not present

## 2022-12-08 DIAGNOSIS — Z3169 Encounter for other general counseling and advice on procreation: Secondary | ICD-10-CM | POA: Diagnosis not present

## 2022-12-08 DIAGNOSIS — R300 Dysuria: Secondary | ICD-10-CM | POA: Diagnosis not present

## 2022-12-08 LAB — URINALYSIS, COMPLETE W/RFL CULTURE
Bilirubin Urine: NEGATIVE
Casts: NONE SEEN /LPF
Crystals: NONE SEEN /HPF
Glucose, UA: NEGATIVE
Hgb urine dipstick: NEGATIVE
Ketones, ur: NEGATIVE
Leukocyte Esterase: NEGATIVE
Nitrites, Initial: NEGATIVE
Protein, ur: NEGATIVE
RBC / HPF: NONE SEEN /HPF (ref 0–2)
Specific Gravity, Urine: 1.023 (ref 1.001–1.035)
WBC, UA: NONE SEEN /HPF (ref 0–5)
Yeast: NONE SEEN /HPF
pH: 5.5 (ref 5.0–8.0)

## 2022-12-08 LAB — CULTURE INDICATED

## 2022-12-08 LAB — WET PREP FOR TRICH, YEAST, CLUE

## 2022-12-08 NOTE — Progress Notes (Signed)
   Acute Office Visit  Subjective:    Patient ID: [name], female    DOB: 27-Aug-2000, 22 y.o.   MRN: [id number]   HPI 22 y.o. presents today for frequent urination and lower abdominal pain x 1.5 weeks. Feels like she needs to go often, sometimes cannot fall asleep until she has urinated multiple times, small amounts. Treated for BV 11/15/22 with metronidazole. Symptoms resolved. Negative STD screenings. Did have a few bowel movements today, normal consistency.. Uncommon for her. She is wondering if she should get her hormones checked. Has been trying to conceive ~2 years. Tracking ovulation, increasing intercourse during ovulation window. Does not feel she has cervical mucous changes during ovulation like she used to. 28 day cycles.   Patient's last menstrual period was 11/22/2022 (exact date).    Review of Systems  Constitutional: Negative.   Gastrointestinal:  Positive for abdominal pain. Negative for constipation, diarrhea, nausea and vomiting.  Genitourinary:  Positive for frequency. Negative for difficulty urinating, dysuria, flank pain, hematuria, pelvic pain, urgency, vaginal bleeding, vaginal discharge and vaginal pain.       Objective:    Physical Exam Constitutional:      Appearance: Normal appearance.  Genitourinary:    General: Normal vulva.     Vagina: Normal.     Cervix: Normal.     BP 114/62   Pulse 79   Temp 98.5 F (36.9 C)   Wt 114 lb (51.7 kg)   LMP 11/22/2022 (Exact Date)   SpO2 100%   BMI 21.54 kg/m  Wt Readings from Last 3 Encounters:  12/08/22 114 lb (51.7 kg)  12/06/22 115 lb 8.3 oz (52.4 kg)  11/15/22 115 lb 9.6 oz (52.4 kg)        Patient informed chaperone available to be present for breast and/or pelvic exam. Patient has requested no chaperone to be present. Patient has been advised what will be completed during breast and pelvic exam.   UA negative Wet prep negative for pathogens  Assessment & Plan:   Problem List Items Addressed  This Visit       Other   Lower abdominal pain   Relevant Orders   WET PREP FOR TRICH, YEAST, CLUE   Other Visit Diagnoses     Urinary frequency    -  Primary   Relevant Orders   Urinalysis,Complete w/RFL Culture   Encounter for preconception consultation          Plan: Negative wet prep and UA. Will follow up with PCP if symptoms continue. Discussed cycle tracking, ovulation windows, cervical mucous changes, increasing intercourse, healthy living for both patient and partner for pregnancy promotion. Offered hormone testing days 3 and 21. She is considering.      Olivia Mackie DNP, 3:48 PM 12/08/2022

## 2022-12-09 DIAGNOSIS — Z419 Encounter for procedure for purposes other than remedying health state, unspecified: Secondary | ICD-10-CM | POA: Diagnosis not present

## 2022-12-28 ENCOUNTER — Ambulatory Visit: Payer: Medicaid Other | Admitting: Student

## 2022-12-29 ENCOUNTER — Encounter: Payer: Self-pay | Admitting: Family Medicine

## 2022-12-29 ENCOUNTER — Ambulatory Visit (INDEPENDENT_AMBULATORY_CARE_PROVIDER_SITE_OTHER): Payer: Medicaid Other | Admitting: Family Medicine

## 2022-12-29 VITALS — BP 93/67 | HR 79 | Ht 61.0 in | Wt 113.0 lb

## 2022-12-29 DIAGNOSIS — R1084 Generalized abdominal pain: Secondary | ICD-10-CM

## 2022-12-29 DIAGNOSIS — L309 Dermatitis, unspecified: Secondary | ICD-10-CM

## 2022-12-29 DIAGNOSIS — Z113 Encounter for screening for infections with a predominantly sexual mode of transmission: Secondary | ICD-10-CM | POA: Diagnosis not present

## 2022-12-29 DIAGNOSIS — R350 Frequency of micturition: Secondary | ICD-10-CM

## 2022-12-29 DIAGNOSIS — R3589 Other polyuria: Secondary | ICD-10-CM

## 2022-12-29 DIAGNOSIS — Z114 Encounter for screening for human immunodeficiency virus [HIV]: Secondary | ICD-10-CM | POA: Diagnosis not present

## 2022-12-29 LAB — POCT URINALYSIS DIP (MANUAL ENTRY)
Bilirubin, UA: NEGATIVE
Blood, UA: NEGATIVE
Glucose, UA: NEGATIVE mg/dL
Leukocytes, UA: NEGATIVE
Nitrite, UA: NEGATIVE
Spec Grav, UA: 1.02 (ref 1.010–1.025)
Urobilinogen, UA: 0.2 E.U./dL
pH, UA: 8.5 — AB (ref 5.0–8.0)

## 2022-12-29 LAB — POCT URINE PREGNANCY: Preg Test, Ur: NEGATIVE

## 2022-12-29 MED ORDER — TRIAMCINOLONE ACETONIDE 0.1 % EX CREA: 1.0000 | TOPICAL_CREAM | Freq: Two times a day (BID) | CUTANEOUS | 0 refills | Status: DC

## 2022-12-29 NOTE — Assessment & Plan Note (Signed)
She completed regular period las week.  No concerns about pregnancy She felt it is related to her increased urine frequency as it started about the same time. Pain is at baseline now May use Tylenol as needed for pain Monitor for now while working up her polyuria Consider imaging in the future

## 2022-12-29 NOTE — Addendum Note (Signed)
Addended by: Janit Pagan T on: 12/29/2022 03:08 PM   Modules accepted: Orders

## 2022-12-29 NOTE — Assessment & Plan Note (Addendum)
Since she is getting blood drawn today, she requested HIV and RPR testing GU exam not done today as she endorsed no concerns F/U with PCP for reeval as needed

## 2022-12-29 NOTE — Progress Notes (Addendum)
SUBJECTIVE:   CHIEF COMPLAINT / HPI:   Urinary Frequency  This is a new problem. Episode onset: Increased urine frequency started 1 month ago. The problem occurs intermittently. The problem has been gradually worsening. The pain is at a severity of 0/10 (No pain with urination). There has been no fever. Sexually active: New partner x 1 month. There is No history of pyelonephritis. Associated symptoms include frequency and hesitancy. Pertinent negatives include no discharge, hematuria, nausea, possible pregnancy or vomiting. Associated symptoms comments: LMP 12/19/22, regular. Not on birth control. Denies dysuria, vaginal bleed or vaginitis. Treatments tried: Cut back on drinking a lot of water. There is no history of recurrent UTIs.  Abdominal Pain This is a new problem. The current episode started 1 to 4 weeks ago. The onset quality is gradual. The problem occurs constantly. The problem has been unchanged. The pain is located in the generalized abdominal region. The pain is at a severity of 6/10. Quality: Mild cramping. The abdominal pain does not radiate. Associated symptoms include frequency. Pertinent negatives include no dysuria, hematuria, nausea or vomiting. Nothing aggravates the pain. The pain is relieved by Nothing. She has tried nothing for the symptoms.  Rash This is a new (Bumpy rash on the back of her right thigh started about 2 months ago. Itchy dry and discolored) problem. She was exposed to nothing. Pertinent negatives include no vomiting.     PERTINENT  PMH / PSH: PMHx reviewed  OBJECTIVE:   BP 93/67   Pulse 79   Ht 5\' 1"  (1.549 m)   Wt 113 lb (51.3 kg)   LMP 12/19/2022 (Exact Date)   SpO2 100%   BMI 21.35 kg/m   Physical Exam Vitals and nursing note reviewed.  Cardiovascular:     Rate and Rhythm: Normal rate and regular rhythm.     Heart sounds: Normal heart sounds. No murmur heard. Pulmonary:     Effort: Pulmonary effort is normal. No respiratory distress.      Breath sounds: Normal breath sounds.  Abdominal:     General: Abdomen is flat. There is no distension.     Palpations: Abdomen is soft. There is no mass.     Tenderness: There is no abdominal tenderness.  Musculoskeletal:     Right lower leg: No edema.     Left lower leg: No edema.  Skin:    Comments: Scanty hyperpigmented, dry, scaly maculopapular lesion of her right upper thigh posteriorly.      ASSESSMENT/PLAN:   Polyuria No polydipsia Endorsed remote family hx of DM UA today showed trace protein and mildly elevated pH with ketone May be due to dehydration from excessive urination R/O DI vs DM Checked Urine Osm, Serum Osm, Cmet, A1C and TSH today I will contact her with test results   Generalized abdominal pain She completed regular period las week.  No concerns about pregnancy She felt it is related to her increased urine frequency as it started about the same time. Pain is at baseline now May use Tylenol as needed for pain Monitor for now while working up her polyuria Consider imaging in the future  Dermatitis ?? Contact dermatitis Trial triamcinolone cream Cream escribed  Routine screening for STI (sexually transmitted infection) Since she is getting blood drawn today, she requested HIV and RPR testing GU exam not done today as she endorsed no concerns F/U with PCP for reeval as needed   NB: I called to inform her about adding upreg to her test which  she agreed. This is negative. I will call to alert her.  Janit Pagan, MD [facility]

## 2022-12-29 NOTE — Assessment & Plan Note (Addendum)
No polydipsia Endorsed remote family hx of DM UA today showed trace protein and mildly elevated pH with ketone May be due to dehydration from excessive urination R/O DI vs DM Checked Urine Osm, Serum Osm, Cmet, A1C and TSH today I will contact her with test results

## 2022-12-29 NOTE — Patient Instructions (Signed)
It was nice seeing you today. You do have protein in your urine which warrants additional blood and urine testing. I will contact you soon with test results.

## 2022-12-29 NOTE — Assessment & Plan Note (Addendum)
??   Contact dermatitis Trial triamcinolone cream Cream escribed

## 2022-12-30 ENCOUNTER — Telehealth: Payer: Self-pay | Admitting: Family Medicine

## 2022-12-30 DIAGNOSIS — R3589 Other polyuria: Secondary | ICD-10-CM

## 2022-12-30 NOTE — Addendum Note (Signed)
Addended by: Janit Pagan T on: 12/30/2022 12:18 PM   Modules accepted: Orders

## 2022-12-30 NOTE — Telephone Encounter (Addendum)
Test result discussed - Electrolytes, A1C, TSH, HIV, and RPR look great. Urine/Serum Osm pending. I reviewed her meds, and it is less likely that her polyuria is caused by the meds she is on. I have no answer for her, and she is extremely worried as she still urinates a lot. PCP F/U discussed with her, but she wants to be referred for a second opinion. I placed an nephrologist referral for Diabetes Insipidus eval. F/U with PCP as discussed.

## 2022-12-31 ENCOUNTER — Telehealth: Payer: Self-pay | Admitting: Family Medicine

## 2022-12-31 DIAGNOSIS — R102 Pelvic and perineal pain: Secondary | ICD-10-CM

## 2022-12-31 LAB — CMP14+EGFR
ALT: 8 IU/L (ref 0–32)
AST: 17 IU/L (ref 0–40)
Albumin: 4.5 g/dL (ref 4.0–5.0)
Alkaline Phosphatase: 50 IU/L (ref 44–121)
BUN/Creatinine Ratio: 12 (ref 9–23)
BUN: 9 mg/dL (ref 6–20)
Bilirubin Total: 0.3 mg/dL (ref 0.0–1.2)
CO2: 22 mmol/L (ref 20–29)
Calcium: 9.5 mg/dL (ref 8.7–10.2)
Chloride: 104 mmol/L (ref 96–106)
Creatinine, Ser: 0.77 mg/dL (ref 0.57–1.00)
Globulin, Total: 2.8 g/dL (ref 1.5–4.5)
Glucose: 68 mg/dL — ABNORMAL LOW (ref 70–99)
Potassium: 4.2 mmol/L (ref 3.5–5.2)
Sodium: 142 mmol/L (ref 134–144)
Total Protein: 7.3 g/dL (ref 6.0–8.5)
eGFR: 111 mL/min/{1.73_m2} (ref >59)

## 2022-12-31 LAB — OSMOLALITY, URINE: Osmolality, Ur: 978 mOsmol/kg

## 2022-12-31 LAB — OSMOLALITY: Osmolality Meas: 293 mosm/kg (ref 275–295)

## 2022-12-31 LAB — RPR: RPR Ser Ql: NONREACTIVE

## 2022-12-31 LAB — TSH RFX ON ABNORMAL TO FREE T4: TSH: 0.795 u[IU]/mL (ref 0.450–4.500)

## 2022-12-31 LAB — HEMOGLOBIN A1C
Est. average glucose Bld gHb Est-mCnc: 105 mg/dL
Hgb A1c MFr Bld: 5.3 % (ref 4.8–5.6)

## 2022-12-31 LAB — HIV ANTIBODY (ROUTINE TESTING W REFLEX): HIV Screen 4th Generation wRfx: NONREACTIVE

## 2022-12-31 NOTE — Telephone Encounter (Signed)
Spoke with patient. Informed of Pelvic Ultrasound on Thur. 8/29 at 11:15am, patient must drink 32oz of water make sure to have a full bladder. Aquilla Solian, CMA

## 2022-12-31 NOTE — Telephone Encounter (Addendum)
Serum Osm slightly elevated, Urine Osm in the 900 Serum sodium normal Unclear what to make of this - less likely SIADH given normal sodium Discussed nephrology referral. She'll be contacted from their office. She endorsed persistent pressure on her bladder Prefers bladder U/S prior to seeing the nephrologist [name] ordered I also encouraged her to schedule f/u for pelvic exam and STD screening as this was deferred last visit due to no vaginitis concerns. Need to r/o GC/Chlam She agreed with the plan.

## 2022-12-31 NOTE — Progress Notes (Signed)
Serum Osm slightly elevated, Urine Osm in the 900 Serum sodium normal Unclear what to make of this - less likely SIADH given normal sodium Discussed nephrology referral. She'll be contacted from their office. She endorsed persistent pressure on her bladder Prefers bladder U/S prior to seeing the nephrologist [name] ordered I also encouraged her to schedule f/u for pelvic exam and STD screening as this was deferred due to no vaginitis concerns. Need to r/o GC/Chlam She agreed with the plan.

## 2023-01-03 ENCOUNTER — Ambulatory Visit: Payer: Medicaid Other

## 2023-01-04 ENCOUNTER — Other Ambulatory Visit: Payer: Self-pay

## 2023-01-04 ENCOUNTER — Ambulatory Visit (INDEPENDENT_AMBULATORY_CARE_PROVIDER_SITE_OTHER): Payer: Medicaid Other | Admitting: Student

## 2023-01-04 ENCOUNTER — Encounter: Payer: Self-pay | Admitting: Student

## 2023-01-04 VITALS — BP 98/86 | HR 89 | Ht 61.0 in | Wt 114.2 lb

## 2023-01-04 DIAGNOSIS — R3589 Other polyuria: Secondary | ICD-10-CM | POA: Diagnosis not present

## 2023-01-04 NOTE — Progress Notes (Signed)
  SUBJECTIVE:   CHIEF COMPLAINT / HPI:   STI Check  Pelvic Exam: -pap: last 05/13/2020, NILM, positive for candida  -Last tested for HIV & RPR (negative) on 12/29/22 -Does not feel like she has abnormal vaginal discharge today  -Did have some pelvic pain after sex  -Last tested for STI in 11/2022 and was negative, has not had any new partners since or change in symptoms  -Most concerned about urinary frequency.    PERTINENT  PMH / PSH: Recurrent candida, urinary frequency  Patient Care Team: Latrelle Dodrill, MD as PCP - General (Family Medicine) OBJECTIVE:  BP 98/86   Pulse 89   Ht 5\' 1"  (1.549 m)   Wt 114 lb 3.2 oz (51.8 kg)   LMP 12/19/2022 (Exact Date)   SpO2 98%   BMI 21.58 kg/m  Physical Exam  General: NAD, pleasant, able to participate in exam Respiratory: No respiratory distress Skin: warm and dry, no rashes noted Psych: Normal affect and mood  ASSESSMENT/PLAN:  Polyuria Assessment & Plan: Patient was seen for STI check although she reports she does not want any STI testing today.  Patient currently declines pelvic examination, we discussed that she can get gonorrhea/chlamydia testing when she would like.  She should continue with pelvic ultra sound as scheduled at the end of August, provided the patient with this information.    Return for After pelvic U/S . Alfredo Martinez, MD 01/04/2023, 4:21 PM PGY-3, [facility]

## 2023-01-04 NOTE — Patient Instructions (Addendum)
It was great to see you today! Thank you for choosing [facility] for your primary care.  Today we addressed: Go to your bladder U/S: Thur. 8/29 at 11:15am, patient must drink 32oz of water make sure to have a full bladder  The nephrologist (kidney doctors will call you  If you haven't already, sign up for My Chart to have easy access to your labs results, and communication with your primary care physician. I recommend that you always bring your medications to each appointment as this makes it easy to ensure you are on the correct medications and helps [name] not miss refills when you need them. Call the clinic at [phone] if your symptoms worsen or you have any concerns. Return for After pelvic U/S . Please arrive 15 minutes before your appointment to ensure smooth check in process.  We appreciate your efforts in making this happen.  Thank you for allowing me to participate in your care, Michele Martinez, MD 01/04/2023, 3:02 PM PGY-3, [facility]

## 2023-01-04 NOTE — Assessment & Plan Note (Signed)
Patient was seen for STI check although she reports she does not want any STI testing today.  Patient currently declines pelvic examination, we discussed that she can get gonorrhea/chlamydia testing when she would like.  She should continue with pelvic ultra sound as scheduled at the end of August, provided the patient with this information.

## 2023-01-05 ENCOUNTER — Telehealth: Payer: Self-pay
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
## 2023-01-05 NOTE — Telephone Encounter (Signed)
Yes, patient will need to be scheduled to discuss this issue.  Thanks Latrelle Dodrill, MD

## 2023-01-06 ENCOUNTER — Ambulatory Visit (HOSPITAL_COMMUNITY)
Admission: RE | Admit: 2023-01-06 | Discharge: 2023-01-06 | Disposition: A | Discharge status: Home / Self Care | Payer: Medicaid Other | Source: Ambulatory Visit | Attending: Family Medicine | Admitting: Family Medicine

## 2023-01-06 DIAGNOSIS — D259 Leiomyoma of uterus, unspecified: Secondary | ICD-10-CM | POA: Diagnosis not present

## 2023-01-06 DIAGNOSIS — R102 Pelvic and perineal pain: Secondary | ICD-10-CM | POA: Diagnosis not present

## 2023-01-07 ENCOUNTER — Telehealth: Payer: Self-pay | Admitting: Family Medicine

## 2023-01-07 NOTE — Telephone Encounter (Signed)
Pelvic US discussed Small fibroid will not explain her polyuria F/U with nephrologist as referred as well as PCP ED precaution discussed She agreed with the plan and verbalized understanding

## 2023-01-07 NOTE — Telephone Encounter (Signed)
Patient scheduled for 9/6

## 2023-01-09 DIAGNOSIS — Z419 Encounter for procedure for purposes other than remedying health state, unspecified: Secondary | ICD-10-CM | POA: Diagnosis not present

## 2023-01-14 ENCOUNTER — Other Ambulatory Visit: Payer: Self-pay

## 2023-01-14 ENCOUNTER — Ambulatory Visit (INDEPENDENT_AMBULATORY_CARE_PROVIDER_SITE_OTHER): Payer: Medicaid Other | Admitting: Student

## 2023-01-14 ENCOUNTER — Encounter: Payer: Self-pay | Admitting: Student

## 2023-01-14 VITALS — BP 96/62 | HR 80 | Ht 62.0 in | Wt 115.2 lb

## 2023-01-14 DIAGNOSIS — S0990XD Unspecified injury of head, subsequent encounter: Secondary | ICD-10-CM | POA: Diagnosis not present

## 2023-01-14 DIAGNOSIS — S0990XS Unspecified injury of head, sequela: Secondary | ICD-10-CM | POA: Insufficient documentation
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
## 2023-01-14 NOTE — Assessment & Plan Note (Signed)
Patient comes in for follow-up of head injury, requesting letter for work.  Patient notes she had a head injury in May, where a box fell on her head.  Patient had no loss of consciousness but developed headaches and was sent to neurology following accident.  Patient also was seen by [employer]. physician who referred her to neurology.  Patient reports that she was on half duty, and is attempted to return to full duty, but felt lightheaded dizzy when working.  Patient requesting a letter for half duty, so she can follow-up with neurologist at end of October.  Patient had normal neuroexam.  However given patient's concern for dizziness/syncope do not want patient putting herself at risk at work.  Will provide letter.  Also discussed short-term disability/FMLA with patient.  Patient symptoms could be coming from postconcussive syndrome, or new development of migraines, or some other neurological dysfunction.  Will have patient follow-up with neurology - Work note for half duty - Consider short-term disability/FMLA - Follow-up with neurology

## 2023-01-14 NOTE — Patient Instructions (Signed)
It was great to see you! Thank you for allowing me to participate in your care!   Our plans for today:  - Work Note  Will write a work note, hopefully they will accept it. Consider Short Term Disability or FMLA  - Short Derm Disability/ FMLA Contact your employer about Short Term Disability or FMLA if you need leave or reduced time/duty activities and have them sent to the clinic.   - Dizziness and Headache  Follow up with Neurology   Take care and seek immediate care sooner if you develop any concerns.   Dr. Bess Kinds, MD [facility]

## 2023-01-16 ENCOUNTER — Ambulatory Visit (HOSPITAL_COMMUNITY)
Admission: EM | Admit: 2023-01-16 | Discharge: 2023-01-16 | Disposition: A | Discharge status: Home / Self Care | Payer: Medicaid Other

## 2023-01-16 ENCOUNTER — Encounter (HOSPITAL_COMMUNITY): Payer: Self-pay

## 2023-01-16 DIAGNOSIS — Z701 Counseling related to patient's sexual behavior and orientation: Secondary | ICD-10-CM

## 2023-01-16 DIAGNOSIS — Z202 Contact with and (suspected) exposure to infections with a predominantly sexual mode of transmission: Secondary | ICD-10-CM

## 2023-01-16 NOTE — Discharge Instructions (Signed)
Continue safe sex practices.  Continue self exams.   Notify provider of any abnormalities.   HSV -herpes simplex virus can only be treated not cured.    If you have never had any lesions or blisters the probability of you having HSV is small.
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
## 2023-01-16 NOTE — ED Triage Notes (Addendum)
Pt is here for herpes testing. Denies any recent symptoms or exposure.

## 2023-01-20 ENCOUNTER — Encounter: Payer: Self-pay | Admitting: Student

## 2023-02-01 ENCOUNTER — Ambulatory Visit: Payer: Medicaid Other

## 2023-02-10 ENCOUNTER — Ambulatory Visit (INDEPENDENT_AMBULATORY_CARE_PROVIDER_SITE_OTHER): Payer: Self-pay | Admitting: Family Medicine

## 2023-02-10 ENCOUNTER — Other Ambulatory Visit: Payer: Self-pay

## 2023-02-10 ENCOUNTER — Other Ambulatory Visit (HOSPITAL_COMMUNITY)
Admission: RE | Admit: 2023-02-10 | Discharge: 2023-02-10 | Disposition: A | Discharge status: Home / Self Care | Payer: Self-pay | Source: Ambulatory Visit | Attending: Family Medicine | Admitting: Family Medicine

## 2023-02-10 VITALS — BP 105/65 | HR 93 | Ht 62.0 in | Wt 115.4 lb

## 2023-02-10 DIAGNOSIS — B9689 Other specified bacterial agents as the cause of diseases classified elsewhere: Secondary | ICD-10-CM

## 2023-02-10 DIAGNOSIS — N898 Other specified noninflammatory disorders of vagina: Secondary | ICD-10-CM

## 2023-02-10 DIAGNOSIS — N760 Acute vaginitis: Secondary | ICD-10-CM

## 2023-02-10 LAB — POCT WET PREP (WET MOUNT)
Clue Cells Wet Prep Whiff POC: POSITIVE
Trichomonas Wet Prep HPF POC: ABSENT

## 2023-02-10 NOTE — Patient Instructions (Addendum)
It was wonderful to see you today.  Please bring ALL of your medications with you to every visit.   Today we talked about:  Vaginal discharge - I recommend you ask for a beta-HCG test at your appointment. This would allow them to evaluate for if there is any more fetal material that remained.   I have tested for BV, yeast, and some STDs today and will let you know the results.   Thank you for choosing [facility].   Please call [phone] with any questions about today's appointment.  Lockie Mola, MD  Family Medicine

## 2023-02-10 NOTE — Progress Notes (Signed)
    SUBJECTIVE:   CHIEF COMPLAINT / HPI:   Malodorous Discharge  Patient reports that for the past two days she has noticed an odor in her discharge. Says that she had a medical abortion about a week ago and just recently stopped bleeding and spotting. Noticed her discharge was odorous. Endorses some vaginal itching. Says she has had recurrent BV in the past. Says metrogel did not work for her and that her [employer] has given her terconazole topical that had helped in the past. No abdominal pain since after abortion. Denies heavy bleeding or cramping now.   PERTINENT  PMH / PSH: None  OBJECTIVE:   BP 105/65   Pulse 93   Ht 5\' 2"  (1.575 m)   Wt 115 lb 6.4 oz (52.3 kg)   SpO2 100%   BMI 21.11 kg/m   General: well appearing, in no acute distress CV: RRR, radial pulses equal and palpable Resp: Normal work of breathing on room air Abd: Soft, non tender, non distended  GU: (chaperoned by CMA_): no CMT, no friability of cervix, thin white discharge pooling near cervix   ASSESSMENT/PLAN:   Assessment & Plan Foul smelling vaginal discharge Patient most likely has BV. However, could have yeast. Unlikely endometritis as patient is not having copious bleeding and is well appearing.  -- Wet prep, GC/CT, trich  - Follow up with [facility] choice tomorrow  - Follow up with PCP regarding recurrent BV       Lockie Mola, MD [facility]

## 2023-02-11 ENCOUNTER — Telehealth: Payer: Self-pay

## 2023-02-11 MED ORDER — METRONIDAZOLE 500 MG PO TABS
500.0000 mg | ORAL_TABLET | Freq: Three times a day (TID) | ORAL | 0 refills | Status: DC
Start: 2023-02-11 — End: 2023-02-23

## 2023-02-11 NOTE — Telephone Encounter (Signed)
Patient calls nurse line in regards to test results.   She reports she viewed her positive BV results on mychart.  She is requesting Flagly to her pharmacy.   Will forward to provider who saw patient.

## 2023-02-14 LAB — CERVICOVAGINAL ANCILLARY ONLY
Chlamydia: NEGATIVE
Comment: NEGATIVE
Comment: NEGATIVE
Comment: NORMAL
Neisseria Gonorrhea: NEGATIVE
Trichomonas: NEGATIVE

## 2023-02-16 ENCOUNTER — Telehealth: Payer: Self-pay

## 2023-02-16 ENCOUNTER — Telehealth: Payer: Self-pay | Admitting: Student

## 2023-02-16 NOTE — Telephone Encounter (Signed)
**   AFTER-HOURS/EMERGENCY LINE CALL **  Patient had question regarding Metronidazole for treatment of bacterial vaginosis. She said she was prescribed for TID, but that she usually only takes this twice daily for BV in the past.  Dr. Marsh Dolly prescribed Metronidazole 500 mg TID for 7 days (#21 tablets) on 10/4.   I advised patient that she does not need to take this TID, especially since she is having stomach cramping/nausea. She will take BID to complete 7 day course. Advised her to take with breakfast and dinner, not on an empty stomach and that it is okay to continue her probiotics.  No further questions. If her abdominal pain persists or becomes concerning, she wil come to clinic for an appointment.  Darral Dash, DO PGY-3 [facility]

## 2023-02-16 NOTE — Telephone Encounter (Signed)
Patient calls nurse line in regards to Flagyl.   She reports she picked up the prescription, however reports the medication was written for TID vs BID.   She reports she has never taken this medication 3x per day before for BV.  She is requesting clarification.   Will forward to provider who saw patient.

## 2023-02-17 NOTE — Telephone Encounter (Signed)
Called patient and informed of message per Dr. Marsh Dolly. Patient is asking how she should proceed given that she took medication TID for 3 days.   She is asking if she should discontinue medication early or finish course.   Please advise.   Michele Prude, RN

## 2023-02-17 NOTE — Telephone Encounter (Signed)
Can  you call her and clarify that this should be BID.

## 2023-02-23 ENCOUNTER — Ambulatory Visit (HOSPITAL_COMMUNITY)
Admission: EM | Admit: 2023-02-23 | Discharge: 2023-02-23 | Disposition: A | Discharge status: Home / Self Care | Payer: Self-pay | Attending: Family Medicine | Admitting: Family Medicine

## 2023-02-23 ENCOUNTER — Encounter (HOSPITAL_COMMUNITY): Payer: Self-pay

## 2023-02-23 DIAGNOSIS — Z113 Encounter for screening for infections with a predominantly sexual mode of transmission: Secondary | ICD-10-CM | POA: Insufficient documentation

## 2023-02-23 LAB — HIV ANTIBODY (ROUTINE TESTING W REFLEX): HIV Screen 4th Generation wRfx: NONREACTIVE

## 2023-02-23 NOTE — ED Triage Notes (Signed)
Patient states, "I am here for blood work. I was told I could be tested for HIV and herpes.

## 2023-02-23 NOTE — ED Provider Notes (Signed)
  [facility]   [id number] 02/23/23 Arrival Time: 1546  ASSESSMENT & PLAN:  1. Screening for STDs (sexually transmitted diseases)    Without s/s of PID.  Pending:  HIV ANTIBODY (ROUTINE TESTING W REFLEX)  RPR  CERVICOVAGINAL ANCILLARY ONLY   Will notify of any positive results. Instructed to refrain from sexual activity for at least seven days.  Reviewed expectations re: course of current medical issues. Questions answered. Outlined signs and symptoms indicating need for more acute intervention. Patient verbalized understanding. After Visit Summary given.   SUBJECTIVE:  [name] is a 22 y.o. female who requests STD testing. New partner. No symptoms  Patient's last menstrual period was 02/22/2023.   OBJECTIVE:  Vitals:   02/23/23 1604  BP: 111/69  Pulse: 84  Resp: 14  Temp: 98.4 F (36.9 C)  TempSrc: Oral  SpO2: 100%    General appearance: alert, cooperative, appears stated age and no distress GU: deferred Skin: warm and dry Psychological: alert and cooperative; normal mood and affect.    Labs Reviewed  HIV ANTIBODY (ROUTINE TESTING W REFLEX)  RPR  CERVICOVAGINAL ANCILLARY ONLY    Allergies  Allergen Reactions   Penicillins Rash   Amoxicillin    Tramadol     Past Medical History:  Diagnosis Date   Screen for STD (sexually transmitted disease) 02/28/2017   Seasonal allergies    Vaginal bleeding 09/17/2016   Vulvovaginal candidiasis 11/15/2014   Family History  Problem Relation Age of Onset   Hypertension Mother    Heart disease Mother    Diabetes Sister    Asthma Sister    Diabetes Maternal Grandmother    Cancer Maternal Grandmother        Bladder   Hypertension Maternal Grandmother    Arthritis Maternal Grandmother    Arthritis Maternal Grandfather    Cancer Other        Maternal Great Aunt Breast Cancer   Social History   Socioeconomic History   Marital status: Single    Spouse name: Not on file   Number of children:  Not on file   Years of education: Not on file   Highest education level: Not on file  Occupational History   Not on file  Tobacco Use   Smoking status: Never   Smokeless tobacco: Never  Vaping Use   Vaping status: Never Used  Substance and Sexual Activity   Alcohol use: Yes   Drug use: Never   Sexual activity: Yes    Partners: Male    Birth control/protection: None, Condom    Comment: First IC >16 y/o, No hx of STIs  Other Topics Concern   Not on file  Social History Narrative   Not on file   Social Determinants of Health   Financial Resource Strain: Not on file  Food Insecurity: Not on file  Transportation Needs: Not on file  Physical Activity: Not on file  Stress: Not on file  Social Connections: Not on file  Intimate Partner Violence: Not on file           [place], MD 02/23/23 [phone]

## 2023-02-24 LAB — RPR: RPR Ser Ql: NONREACTIVE

## 2023-02-24 LAB — CERVICOVAGINAL ANCILLARY ONLY
Chlamydia: NEGATIVE
Comment: NEGATIVE
Comment: NEGATIVE
Comment: NORMAL
Neisseria Gonorrhea: NEGATIVE
Trichomonas: NEGATIVE

## 2023-03-21 ENCOUNTER — Ambulatory Visit: Payer: Self-pay | Admitting: Family Medicine

## 2023-03-23 ENCOUNTER — Encounter: Payer: Self-pay | Admitting: Family Medicine

## 2023-03-23 ENCOUNTER — Ambulatory Visit (INDEPENDENT_AMBULATORY_CARE_PROVIDER_SITE_OTHER): Payer: Self-pay | Admitting: Family Medicine

## 2023-03-23 VITALS — BP 103/76 | HR 91 | Ht 62.0 in | Wt 117.2 lb

## 2023-03-23 DIAGNOSIS — R1030 Lower abdominal pain, unspecified: Secondary | ICD-10-CM

## 2023-03-24 ENCOUNTER — Other Ambulatory Visit (HOSPITAL_COMMUNITY)
Admission: RE | Admit: 2023-03-24 | Discharge: 2023-03-24 | Disposition: A | Discharge status: Home / Self Care | Payer: Self-pay | Source: Ambulatory Visit | Attending: Family Medicine | Admitting: Family Medicine

## 2023-03-24 ENCOUNTER — Ambulatory Visit (INDEPENDENT_AMBULATORY_CARE_PROVIDER_SITE_OTHER): Payer: Self-pay | Admitting: Family Medicine

## 2023-03-24 VITALS — BP 98/58 | HR 87 | Wt 119.0 lb

## 2023-03-24 DIAGNOSIS — N898 Other specified noninflammatory disorders of vagina: Secondary | ICD-10-CM | POA: Insufficient documentation

## 2023-03-24 DIAGNOSIS — B9689 Other specified bacterial agents as the cause of diseases classified elsewhere: Secondary | ICD-10-CM

## 2023-03-24 DIAGNOSIS — N760 Acute vaginitis: Secondary | ICD-10-CM

## 2023-03-24 DIAGNOSIS — R1032 Left lower quadrant pain: Secondary | ICD-10-CM

## 2023-03-24 DIAGNOSIS — Z113 Encounter for screening for infections with a predominantly sexual mode of transmission: Secondary | ICD-10-CM

## 2023-03-24 DIAGNOSIS — R350 Frequency of micturition: Secondary | ICD-10-CM

## 2023-03-24 LAB — POCT WET PREP (WET MOUNT)
Clue Cells Wet Prep Whiff POC: POSITIVE
Trichomonas Wet Prep HPF POC: ABSENT

## 2023-03-24 LAB — POCT URINALYSIS DIP (MANUAL ENTRY)
Bilirubin, UA: NEGATIVE
Blood, UA: NEGATIVE
Glucose, UA: NEGATIVE mg/dL
Leukocytes, UA: NEGATIVE
Nitrite, UA: NEGATIVE
Protein Ur, POC: NEGATIVE mg/dL
Spec Grav, UA: >=1.03 — AB (ref 1.010–1.025)
Urobilinogen, UA: 0.2 U/dL
pH, UA: 5.5 (ref 5.0–8.0)

## 2023-03-24 LAB — POCT URINE PREGNANCY: Preg Test, Ur: NEGATIVE

## 2023-03-24 MED ORDER — METRONIDAZOLE 0.75 % EX GEL
1.0000 | CUTANEOUS | 2 refills | Status: DC
Start: 2023-03-31 — End: 2023-09-30

## 2023-03-24 MED ORDER — METRONIDAZOLE 500 MG PO TABS
500.0000 mg | ORAL_TABLET | Freq: Two times a day (BID) | ORAL | 0 refills | Status: AC
Start: 2023-03-24 — End: 2023-03-31

## 2023-03-24 MED ORDER — METRONIDAZOLE 1 % EX GEL
CUTANEOUS | 3 refills | Status: DC
Start: 2023-03-31 — End: 2023-03-24

## 2023-03-24 NOTE — Assessment & Plan Note (Signed)
Desires routine STI screening, obtained G/C, HIV, RPR, hep C testing

## 2023-03-24 NOTE — Progress Notes (Signed)
    SUBJECTIVE:   CHIEF COMPLAINT / HPI:   Abdominal pain Reports left lower quadrant abdominal pain x 2 weeks.  Worse after eating meals but states it can happen anytime.  2 episodes of nausea associated.  Last stool about 2 to 3 days ago, reports chronic constipation.  Denies vomiting.  Denies right sided abdominal pain.  Endorses some urinary frequency that has been ongoing.  Denies hematuria.  Vaginal discharge Started in the last couple days.  Has 2 prior episodes of BV in the last 4 months.  Does not occur after menstrual cycle or intercourse. Sexually active without contraception, last period around 02/21/2023.  States she took pregnancy test yesterday and it was negative.  Has used boric acid once.  PERTINENT  PMH / PSH: Migraines  OBJECTIVE:   BP (!) 98/58   Pulse 87   Wt 119 lb (54 kg)   LMP 02/22/2023   SpO2 99%   BMI 21.77 kg/m    General: NAD, pleasant, able to participate in exam Cardiac: RRR, no murmurs. Respiratory: CTAB, normal effort, No wheezes, rales or rhonchi Abdomen: TTP over left lower quadrant and suprapubic region.  No rebound tenderness or guarding. Pelvic exam: normal external genitalia, vulva, vagina, cervix, uterus and adnexa, VAGINA: normal appearing vagina with normal color and discharge, no lesions, vaginal discharge - white, copious, and creamy, WET MOUNT done - results: clue cells, excessive bacteria, exam chaperoned by Page, RN.  Extremities: no edema or cyanosis. Skin: warm and dry, no rashes noted Neuro: alert, no obvious focal deficits Psych: Normal affect and mood  ASSESSMENT/PLAN:   Assessment & Plan LLQ abdominal pain Exam reassuring against acute abdomen. TVUS in 12/2022 only showed small fibroid. Negative UA makes urinary source less likely. Broad differential includes constipation vs IBS vs GI illness vs biliary colic vs endometriosis.  Given chronic constipation, patient would benefit from MiraLAX bowel cleanout to see if it improves  symptoms.  Will obtain abdominal KUB to assess stool burden. -Recommended 1 capful of MiraLAX twice per day until stooling and then backing off dosing -CMP and KUB -Follow-up in 2 weeks to assess symptoms Bacterial vaginosis Wet prep positive for BV.  Third confirmed episode of BV in the past 4 months,meets criteria for recurrent BV. -Metronidazole 500 mg twice daily x 7 days (per patient preference) -Metrogel twice weekly for maintenance therapy -Recommend obtaining OTC boric acid vaginal suppositories twice weekly on days not using Metrogel -F/u in 1 month to assess symptoms Urinary frequency Ongoing frequency. POC UA negative for infection, mild dehydration.  Urine pregnancy test negative.  Unclear source, could consider interstitial cystitis given con commitment abdominal pain versus pelvic floor dysfunction. -Discussed symptoms further follow-up Routine screening for STI (sexually transmitted infection) Desires routine STI screening, obtained G/C, HIV, RPR, hep C testing    Dr. Elberta Fortis, DO [facility]

## 2023-03-24 NOTE — Patient Instructions (Addendum)
It was wonderful to see you today! Thank you for choosing [facility].   Please bring ALL of your medications with you to every visit.   Today we talked about:  We want to check some lab work in addition to your urine to investigate your abdominal pain further.  I would like you to get an abdominal x-ray, you can get this at [address] at [employer].  The order is in you just walk-in and have it done or you can call to make an appointment ahead of time. I would like you to do a bowel cleanout with MiraLAX.  Please take a capful of MiraLAX twice a day until you start to stool then you may reduce back to 1 capful per day.  The goal is to have daily, soft bowel movement. If your abdominal pain continues after this bowel cleanout, I would consider additional imaging and workup.  Please follow-up with your PCP in 2 weeks to discuss further. For the recurrent BV: Please take the oral metronidazole pills twice daily for the next 7 days.  After you complete that please then use the metronidazole vaginal gel twice weekly for the next 3 months.  I would recommend using boric acid vaginal suppository twice a week as well as this will help prevent recurrence.  I will see her back in a month though and tested for BV, if it is clear we can consider stopping some of the treatment.  Please follow up in 2 weeks with PCP to discuss further  If you haven't already, sign up for My Chart to have easy access to your labs results, and communication with your primary care physician.   We are checking some labs today. If they are abnormal, I will call you. If they are normal, I will send you a MyChart message (if it is active) or a letter in the mail. If you do not hear about your labs in the next 2 weeks, please call the office.  Call the clinic at [phone] if your symptoms worsen or you have any concerns.  Please be sure to schedule follow up at the front desk before you leave today.    Elberta Fortis, DO Family Medicine

## 2023-03-25 LAB — COMPREHENSIVE METABOLIC PANEL
ALT: 9 [IU]/L (ref 0–32)
AST: 16 [IU]/L (ref 0–40)
Albumin: 4.7 g/dL (ref 4.0–5.0)
Alkaline Phosphatase: 54 [IU]/L (ref 44–121)
BUN/Creatinine Ratio: 12 (ref 9–23)
BUN: 10 mg/dL (ref 6–20)
Bilirubin Total: 0.2 mg/dL (ref 0.0–1.2)
CO2: 22 mmol/L (ref 20–29)
Calcium: 9.6 mg/dL (ref 8.7–10.2)
Chloride: 103 mmol/L (ref 96–106)
Creatinine, Ser: 0.82 mg/dL (ref 0.57–1.00)
Globulin, Total: 3 g/dL (ref 1.5–4.5)
Glucose: 62 mg/dL — ABNORMAL LOW (ref 70–99)
Potassium: 3.9 mmol/L (ref 3.5–5.2)
Sodium: 141 mmol/L (ref 134–144)
Total Protein: 7.7 g/dL (ref 6.0–8.5)
eGFR: 102 mL/min/{1.73_m2} (ref >59)

## 2023-03-25 LAB — RPR: RPR Ser Ql: NONREACTIVE

## 2023-03-25 LAB — CERVICOVAGINAL ANCILLARY ONLY
Chlamydia: NEGATIVE
Comment: NEGATIVE
Comment: NORMAL
Neisseria Gonorrhea: NEGATIVE

## 2023-03-25 LAB — HCV AB W REFLEX TO QUANT PCR: HCV Ab: NONREACTIVE

## 2023-03-25 LAB — HIV ANTIBODY (ROUTINE TESTING W REFLEX): HIV Screen 4th Generation wRfx: NONREACTIVE

## 2023-03-25 LAB — HCV INTERPRETATION
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
## 2023-03-28 ENCOUNTER — Encounter: Payer: Self-pay | Admitting: Family Medicine

## 2023-04-05 ENCOUNTER — Ambulatory Visit (INDEPENDENT_AMBULATORY_CARE_PROVIDER_SITE_OTHER): Payer: Self-pay | Admitting: Student

## 2023-04-05 ENCOUNTER — Encounter: Payer: Self-pay | Admitting: Student

## 2023-04-05 VITALS — BP 100/75 | HR 99 | Temp 98.8°F | Ht 66.0 in | Wt 117.4 lb

## 2023-04-05 DIAGNOSIS — Z3200 Encounter for pregnancy test, result unknown: Secondary | ICD-10-CM

## 2023-04-05 LAB — POCT URINE PREGNANCY: Preg Test, Ur: POSITIVE — AB

## 2023-04-05 NOTE — Patient Instructions (Addendum)
Nice to see you today!  Your pregnancy test today was positive.  Given the timeframe of your last menstrual period you will probably be somewhere around 4 weeks.  However because your last is uncertain,  I have put in order for ultrasound.  Also provided you information/resources you make other decisions with your pregnancy.

## 2023-04-05 NOTE — Progress Notes (Signed)
    SUBJECTIVE:   CHIEF COMPLAINT / HPI:    Patient is a 22 year old female Presenting today due to positive home pregnancy test. Patient has 2 positive pregnancy test Endorses engaging in and no use of barrier unreceptive. Reports questionable LMP on October 26 LMP lasted for 2 days which was unusual for her periods Period usually heavy and last 5 days. This is an unplanned pregnancy   PERTINENT  PMH / PSH: Reviewed   OBJECTIVE:   BP 100/75   Pulse 99   Temp 98.8 F (37.1 C)   Ht 5\' 6"  (1.676 m)   Wt 117 lb 6.4 oz (53.3 kg)   SpO2 100%   BMI 18.95 kg/m    Physical Exam General: Alert, well appearing, NAD Cardiovascular: RRR, No Murmurs, Normal S2/S2 Respiratory: CTAB, No wheezing or Rales Abdomen: No distension or tenderness Extremities: No edema on extremities     ASSESSMENT/PLAN:   Positive Pregnancy test Pregnancy test in clinic was positive which was consistent with her home readings.  This is an unplanned pregnancy for patient and overall appears anxious about the pregnancy news.  In discussion with patient there does not appears to be any signs of abuse.  Discussed the option of obtaining initial OB lab however patient wants to hold off at this time and would like to determine how far along in pregnancy she is.  Given her questionable LMP, informed patient that an ultrasound will be more accurate for dating.  Patient expressed preference for getting an ultrasound as soon as possible before making any decisions.   -OB ultrasound complete for less than 14 weeks ordered -Provided patient with resources should she consider pregnancy termination -Close follow-up in a week to discuss ultrasound results and plan with patient   Jerre Simon, MD [facility]

## 2023-04-06 ENCOUNTER — Ambulatory Visit (HOSPITAL_COMMUNITY)
Admission: RE | Admit: 2023-04-06 | Discharge: 2023-04-06 | Disposition: A | Discharge status: Home / Self Care | Payer: Self-pay | Source: Ambulatory Visit | Attending: Family Medicine | Admitting: Family Medicine

## 2023-04-06 DIAGNOSIS — Z3200 Encounter for pregnancy test, result unknown: Secondary | ICD-10-CM

## 2023-04-11 ENCOUNTER — Ambulatory Visit (HOSPITAL_BASED_OUTPATIENT_CLINIC_OR_DEPARTMENT_OTHER)
Admission: RE | Admit: 2023-04-11 | Discharge: 2023-04-11 | Disposition: A | Discharge status: Home / Self Care | Payer: Self-pay | Source: Ambulatory Visit | Attending: Family Medicine | Admitting: Family Medicine

## 2023-04-11 ENCOUNTER — Other Ambulatory Visit (HOSPITAL_COMMUNITY)
Admission: RE | Admit: 2023-04-11 | Discharge: 2023-04-11 | Disposition: A | Discharge status: Home / Self Care | Payer: Self-pay | Source: Ambulatory Visit | Attending: Family Medicine | Admitting: Family Medicine

## 2023-04-11 ENCOUNTER — Ambulatory Visit (INDEPENDENT_AMBULATORY_CARE_PROVIDER_SITE_OTHER): Payer: Self-pay | Admitting: Student

## 2023-04-11 ENCOUNTER — Encounter: Payer: Self-pay | Admitting: Student

## 2023-04-11 VITALS — BP 110/67 | HR 85 | Ht 62.0 in | Wt 118.8 lb

## 2023-04-11 DIAGNOSIS — R1030 Lower abdominal pain, unspecified: Secondary | ICD-10-CM | POA: Insufficient documentation

## 2023-04-11 DIAGNOSIS — Z3201 Encounter for pregnancy test, result positive: Secondary | ICD-10-CM

## 2023-04-11 DIAGNOSIS — Z113 Encounter for screening for infections with a predominantly sexual mode of transmission: Secondary | ICD-10-CM

## 2023-04-11 LAB — POCT WET PREP (WET MOUNT)
Clue Cells Wet Prep Whiff POC: NEGATIVE
Trichomonas Wet Prep HPF POC: ABSENT

## 2023-04-11 NOTE — Patient Instructions (Addendum)
Good to see you today.  Your ultrasound did not show any in the uterine/or ectopic pregnancy.  This could be likely due to the ultrasound being too early giveN your last LMP would have put you at somewhere around 5 weeks.  Also because of your new abdominal pain that you are having, ordering a new ultrasound to make sure you do not have ectopic pregnancy and we collected samples today to test you for STD, BV.  Pelvic exam today did not show any mass and was normal.  I have also ordered serum beta-hCG which would be a better results to test for pregnancy as well.

## 2023-04-11 NOTE — Progress Notes (Signed)
WET   SUBJECTIVE:   CHIEF COMPLAINT / HPI: Positive pregnancy test   22 year old female with history of recurrent vaginal candidiasis  presenting for follow-up of positive pregnancy test.  Last week had 2 positive test at home and positive testing clinic.  Transvaginal ultrasound was obtained which was negative for intrauterine/ectopic pregnancy.  Today patient endorses remittent pelvic pain which she describes as sharp/ crampy pain like starting her period. Comes on  and off daily and lasting 15 mins at a time.  PERTINENT  PMH / PSH: Reviewed  OBJECTIVE:   BP 110/67   Pulse 85   Ht 5\' 2"  (1.575 m)   Wt 118 lb 12.8 oz (53.9 kg)   SpO2 100%   BMI 21.73 kg/m    Physical Exam General: Alert, well appearing, NAD Cardiovascular: RRR, No Murmurs, Normal S2/S2 Respiratory: CTAB, No wheezing or Rales Abdomen: No distension or tenderness\ Genitalia:  Normal introitus for age, no external lesions, no vaginal discharge, mucosa pink and moist, no vaginal or cervical lesions, no vaginal atrophy, no friaility or hemorrhage, normal uterus size and position   CMA Deyshia served as chaperone for the exam   ASSESSMENT/PLAN:   Positive pregnancy test Patient had 2 positive point-of-care home pregnancy and 1 positive test in the clinic last week. At patient's request transvaginal ultrasound was ordered last week to confirm pregnancy.  However ultrasound showed no intrauterine or ectopic pregnancy.  Informed patient that if her LMP was October 26 which would put her at 5 weeks there is a high chance that the ultrasound was prematurely obtained and could be the reason for no pregnancy seen in the imaging.  However given patient is reporting new intermittent daily abdominal cramps that was not present last week we will obtain stat ultrasound to make sure we are ruling out ectopic pregnancy or other etiologies.   -Ordered stat transvaginal ultrasound -Obtain lab for GC/chlamydia/components -Ordered  serum beta hCG  Jerre Simon, MD [facility]

## 2023-04-12 ENCOUNTER — Telehealth: Payer: Self-pay | Admitting: Family Medicine

## 2023-04-12 DIAGNOSIS — Z3201 Encounter for pregnancy test, result positive: Secondary | ICD-10-CM

## 2023-04-12 LAB — BETA HCG QUANT (REF LAB): hCG Quant: 1343 m[IU]/mL

## 2023-04-12 NOTE — Telephone Encounter (Signed)
U/S has been scheduled, patient is connect to Mychart. Penni Bombard CMA

## 2023-04-12 NOTE — Telephone Encounter (Signed)
Patient returns call to nurse line requesting to speak with Dr. Pollie Meyer.   She reports that she has additional questions regarding ultrasound from yesterday. She is asking if ultrasound ruled out tubal pregnancy and at what timeframe she would expect to see pregnancy on ultrasound.   Advised that I would forward message to Dr. Pollie Meyer regarding continued questions. 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 Advised that I would message provider regarding letter for work.   Discussed reasons to go to [facility]U.   Forwarding message to PCP.   Veronda Prude, RN

## 2023-04-12 NOTE — Telephone Encounter (Signed)
Dr. Elliot Gurney is out today. Attempted to reach patient to discuss results of hcg level and ultrasound. No answer. Left HIPAA-compliant voicemail asking her to call back.  No IUP seen on [name], but hcg is below discriminatory zone. Recommend repeating hcg tomorrow (~48 hours) and repeating ultrasound in one week. If has any worsened pain or bleeding needs to go immediately to [facility]U for evaluation. I spoke with Dr. Adrian Blackwater (OBGYN) about this case and he is in agreement with this plan.  Will also send a mychart message.  Latrelle Dodrill, MD

## 2023-04-12 NOTE — Telephone Encounter (Signed)
Patient called back and I spoke with her  Pain is stable, not worsened Gave ectopic precautions Scheduled for lab visit tomorrow at 11:30 for repeat quant Ordered repeat ultrasound, to be scheduled 12/9 or later  [facility] red team, can you schedule [name] for 12/9 (or a day or 2 later is ok) and contact patient with appointment info?  FYI to Dr. Elliot Gurney  Thanks Latrelle Dodrill, MD

## 2023-04-13 ENCOUNTER — Encounter: Payer: Self-pay | Admitting: Family Medicine

## 2023-04-13 ENCOUNTER — Other Ambulatory Visit: Payer: Self-pay

## 2023-04-13 ENCOUNTER — Inpatient Hospital Stay (HOSPITAL_COMMUNITY): Payer: Self-pay

## 2023-04-13 ENCOUNTER — Inpatient Hospital Stay (HOSPITAL_COMMUNITY)
Admission: AD | Admit: 2023-04-13 | Discharge: 2023-04-13 | Disposition: A | Discharge status: Home / Self Care | Payer: Self-pay | Attending: Obstetrics and Gynecology | Admitting: Obstetrics and Gynecology

## 2023-04-13 ENCOUNTER — Telehealth: Payer: Self-pay | Admitting: Family Medicine

## 2023-04-13 ENCOUNTER — Encounter (HOSPITAL_COMMUNITY): Payer: Self-pay | Admitting: Obstetrics and Gynecology

## 2023-04-13 DIAGNOSIS — O26891 Other specified pregnancy related conditions, first trimester: Secondary | ICD-10-CM | POA: Insufficient documentation

## 2023-04-13 DIAGNOSIS — Z3A01 Less than 8 weeks gestation of pregnancy: Secondary | ICD-10-CM | POA: Insufficient documentation

## 2023-04-13 DIAGNOSIS — Z3201 Encounter for pregnancy test, result positive: Secondary | ICD-10-CM

## 2023-04-13 DIAGNOSIS — Z6791 Unspecified blood type, Rh negative: Secondary | ICD-10-CM | POA: Insufficient documentation

## 2023-04-13 DIAGNOSIS — R102 Pelvic and perineal pain: Secondary | ICD-10-CM | POA: Insufficient documentation

## 2023-04-13 LAB — CERVICOVAGINAL ANCILLARY ONLY
Bacterial Vaginitis (gardnerella): NEGATIVE
Chlamydia: NEGATIVE
Comment: NEGATIVE
Comment: NEGATIVE
Comment: NEGATIVE
Comment: NORMAL
Neisseria Gonorrhea: NEGATIVE
Trichomonas: NEGATIVE

## 2023-04-13 LAB — HCG, QUANTITATIVE, PREGNANCY: hCG, Beta Chain, Quant, S: 3442 m[IU]/mL — ABNORMAL HIGH (ref <5)

## 2023-04-13 LAB — CBC
HCT: 37.3 % (ref 36.0–46.0)
Hemoglobin: 12.2 g/dL (ref 12.0–15.0)
MCH: 28.3 pg (ref 26.0–34.0)
MCHC: 32.7 g/dL (ref 30.0–36.0)
MCV: 86.5 fL (ref 80.0–100.0)
Platelets: 338 10*3/uL (ref 150–400)
RBC: 4.31 MIL/uL (ref 3.87–5.11)
RDW: 14.2 % (ref 11.5–15.5)
WBC: 5.1 10*3/uL (ref 4.0–10.5)
nRBC: 0 % (ref 0.0–0.2)

## 2023-04-13 LAB — ABO/RH
ABO/RH(D): B NEG
Antibody Screen: NEGATIVE

## 2023-04-13 NOTE — Telephone Encounter (Signed)
Patient called back and spoke to Dr. Manson Passey on the outside calls line earlier and was advised to go to [facility]U.  Michele Dodrill, MD

## 2023-04-13 NOTE — MAU Note (Signed)
..  [name] is a 22 y.o. at Unknown here in MAU reporting: has had quants followed, abdominal pain that began 2 weeks ago. Is concerned because she is still having pain and nothing has been seen on ultrasound The pain is in her lower abdomen, more on her left side and feels like cramping. LMP: unknown, had a TAB in September, but had bleeding during intercourse in October  Pain score: 9/10 Vitals:   04/13/23 2002  BP: 111/68  Pulse: 89  Resp: 17  Temp: 98.8 F (37.1 C)  SpO2: 100%     FHT:n/a

## 2023-04-13 NOTE — MAU Provider Note (Signed)
Chief Complaint: Abdominal Pain   Event Date/Time   First Provider Initiated Contact with Patient 04/13/23 2052        SUBJECTIVE HPI: [name] is a 22 y.o. G1P0000 at Unknown by LMP who presents to maternity admissions reporting recurrent pelvic pain.  Her doctors have been following HCG levels and nothing has been seen on [name].  Reports pain as severe but does not appear to be in distress. Marland KitchenHCG was done today but results unavailable.  She denies vaginal bleeding, n/v, or fever/chills.     Abdominal Pain This is a recurrent problem. The pain is located in the suprapubic region, LLQ and RLQ. The quality of the pain is cramping. The abdominal pain does not radiate. Pertinent negatives include no constipation, diarrhea, dysuria, fever or frequency. Nothing aggravates the pain. The pain is relieved by Nothing. She has tried nothing for the symptoms.   RN Note: .[name] is a 22 y.o. at Unknown here in MAU reporting: has had quants followed, abdominal pain that began 2 weeks ago. Is concerned because she is still having pain and nothing has been seen on ultrasound The pain is in her lower abdomen, more on her left side and feels like cramping. LMP: unknown, had a TAB in September, but had bleeding during intercourse in October  Past Medical History:  Diagnosis Date   Screen for STD (sexually transmitted disease) 02/28/2017   Seasonal allergies    Vaginal bleeding 09/17/2016   Vulvovaginal candidiasis 11/15/2014   Past Surgical History:  Procedure Laterality Date   APPENDECTOMY     LAPAROSCOPIC APPENDECTOMY N/A 11/08/2014   Procedure: APPENDECTOMY LAPAROSCOPIC;  Surgeon: Leonia Corona, MD;  Location: [facility] OR;  Service: Pediatrics;  Laterality: N/A;   Social History   Socioeconomic History   Marital status: Single    Spouse name: Not on file   Number of children: Not on file   Years of education: Not on file   Highest education level: 12th grade  Occupational History   Not on  file  Tobacco Use   Smoking status: Never    Passive exposure: Never   Smokeless tobacco: Never  Vaping Use   Vaping status: Never Used  Substance and Sexual Activity   Alcohol use: Yes   Drug use: Never   Sexual activity: Yes    Partners: Male    Birth control/protection: None, Condom    Comment: First IC >30 y/o, No hx of STIs  Other Topics Concern   Not on file  Social History Narrative   Not on file   Social Determinants of Health   Financial Resource Strain: Medium Risk (04/10/2023)   Overall Financial Resource Strain (CARDIA)    Difficulty of Paying Living Expenses: Somewhat hard  Food Insecurity: No Food Insecurity (04/10/2023)   Hunger Vital Sign    Worried About Running Out of Food in the Last Year: Never true    Ran Out of Food in the Last Year: Never true  Transportation Needs: No Transportation Needs (04/10/2023)   PRAPARE - Administrator, Civil Service (Medical): No    Lack of Transportation (Non-Medical): No  Physical Activity: Sufficiently Active (04/10/2023)   Exercise Vital Sign    Days of Exercise per Week: 4 days    Minutes of Exercise per Session: 150+ min  Stress: No Stress Concern Present (04/10/2023)   [employer] of Occupational Health - Occupational Stress Questionnaire    Feeling of Stress : Only a little  Social Connections: Moderately  Isolated (04/10/2023)   Social Connection and Isolation Panel [NHANES]    Frequency of Communication with Friends and Family: Three times a week    Frequency of Social Gatherings with Friends and Family: More than three times a week    Attends Religious Services: More than 4 times per year    Active Member of [employer] or Organizations: No    Attends Engineer, structural: Not on file    Marital Status: Never married  Intimate Partner Violence: Not on file   No current facility-administered medications on file prior to encounter.   Current Outpatient Medications on File Prior to Encounter   Medication Sig Dispense Refill   BIOTIN PO Take by mouth. (Patient not taking: Reported on 12/29/2022)     butalbital-acetaminophen-caffeine (FIORICET) 50-325-40 MG tablet Take 1-2 tablets by mouth every 6 (six) hours as needed for headache. (Patient not taking: Reported on 12/29/2022) 20 tablet 0   cetirizine (ZYRTEC) 10 MG tablet Take 1 tablet (10 mg total) by mouth daily as needed for allergies. 30 tablet 3   hyoscyamine (ANASPAZ) 0.125 MG TBDP disintergrating tablet Place 1 tablet (0.125 mg total) under the tongue every 6 (six) hours as needed. 30 tablet 0   ipratropium (ATROVENT) 0.06 % nasal spray Place 2 sprays into both nostrils 4 (four) times daily. 15 mL 12   lidocaine (LIDODERM) 5 % Place 1 patch onto the skin daily. Remove & Discard patch within 12 hours or as directed by MD 30 patch 0   methocarbamol (ROBAXIN) 500 MG tablet Take 1 tablet (500 mg total) by mouth 2 (two) times daily. 20 tablet 0   metroNIDAZOLE (METROGEL) 0.75 % gel Apply 1 Application topically 2 (two) times a week. For the next 3 months 45 g 2   naproxen (NAPROSYN) 500 MG tablet Take 1 tablet (500 mg total) by mouth 2 (two) times daily with a meal. 30 tablet 0   ondansetron (ZOFRAN) 4 MG tablet Take 1 tablet (4 mg total) by mouth every 8 (eight) hours as needed for nausea or vomiting. 20 tablet 0   tiZANidine (ZANAFLEX) 2 MG tablet Take 1 tablet (2 mg total) by mouth every 6 (six) hours as needed for muscle spasms. 30 tablet 0   triamcinolone cream (KENALOG) 0.1 % Apply 1 Application topically 2 (two) times daily. 30 g 0   [DISCONTINUED] norelgestromin-ethinyl estradiol (ORTHO EVRA) 150-35 MCG/24HR transdermal patch Place 1 patch onto the skin once a week. 3 patch 12   Allergies  Allergen Reactions   Penicillins Rash   Amoxicillin    Tramadol     I have reviewed patient's Past Medical Hx, Surgical Hx, Family Hx, Social Hx, medications and allergies.   ROS:  Review of Systems  Constitutional:  Negative for  fever.  Gastrointestinal:  Positive for abdominal pain. Negative for constipation and diarrhea.  Genitourinary:  Negative for dysuria and frequency.   Review of Systems  Other systems negative   Physical Exam  Physical Exam Patient Vitals for the past 24 hrs:  BP Temp Temp src Pulse Resp SpO2 Height Weight  04/13/23 2002 111/68 98.8 F (37.1 C) Oral 89 17 100 % 5\' 2"  (1.575 m) 52.6 kg   Constitutional: Well-developed, well-nourished female in no acute distress.  Cardiovascular: normal rate Respiratory: normal effort GI: Abd soft, non-tender. MS: Extremities nontender, no edema, normal ROM Neurologic: Alert and oriented x 4.  GU: Neg CVAT.  PELVIC EXAM: deferred in lieu of ultrasound  LAB RESULTS Results for orders  placed or performed during the hospital encounter of 04/13/23 (from the past 24 hour(s))  ABO/Rh     Status: None   Collection Time: 04/13/23  8:39 PM  Result Value Ref Range   ABO/RH(D) B NEG    Antibody Screen      NEG Performed at [facility], [address], [place], [place] [address]   hCG, quantitative, pregnancy     Status: Abnormal   Collection Time: 04/13/23  8:40 PM  Result Value Ref Range   hCG, Beta Chain, Quant, S 3,442 (H) <5 mIU/mL  CBC     Status: None   Collection Time: 04/13/23  8:40 PM  Result Value Ref Range   WBC 5.1 4.0 - 10.5 K/uL   RBC 4.31 3.87 - 5.11 MIL/uL   Hemoglobin 12.2 12.0 - 15.0 g/dL   HCT 86.5 78.4 - 69.6 %   MCV 86.5 80.0 - 100.0 fL   MCH 28.3 26.0 - 34.0 pg   MCHC 32.7 30.0 - 36.0 g/dL   RDW 29.5 28.4 - 13.2 %   Platelets 338 150 - 400 K/uL   nRBC 0.0 0.0 - 0.2 %     --/--/B NEG Performed at [facility], [address], [place], [place] [address]  [phone])  IMAGING US OB Transvaginal  Result Date: 04/13/2023 CLINICAL DATA:  Pelvic pain during first trimester pregnancy. Left lower quadrant pain for 2 weeks. Estimated gestational age by LMP is 7 weeks 1 day. Quantitative beta HCG is pending.  EXAM: TRANSVAGINAL OB ULTRASOUND TECHNIQUE: Transvaginal ultrasound was performed for complete evaluation of the gestation as well as the maternal uterus, adnexal regions, and pelvic cul-de-sac. COMPARISON:  04/11/2023 FINDINGS: Intrauterine gestational sac: A single intrauterine gestational sac is visualized. Yolk sac:  Possible yolk sac, not well defined.  1.3 mm. Embryo:  No fetal pole identified. Cardiac Activity: Not identified. MSD: 4.6 mm mm   5 w   2 d Subchorionic hemorrhage:  None visualized. Maternal uterus/adnexae: Uterus is anteverted. A small myometrial lesion in the fundus measuring 2 cm maximal diameter consistent with fibroid. The endometrium appears somewhat heterogeneous with tiny cystic spaces demonstrated. This is of uncertain significance or etiology. No free fluid. IMPRESSION: Probable early intrauterine gestational sac, with a possible early yolk sac, but no fetal pole, or cardiac activity yet visualized. Recommend follow-up quantitative B-HCG levels and follow-up US in 14 days to assess viability. This recommendation follows SRU consensus guidelines: Diagnostic Criteria for Nonviable Pregnancy Early in the First Trimester. Malva Limes Med 2013; 366:4403-47. Electronically Signed   By: Burman Nieves M.D.   On: 04/13/2023 22:19     MAU Management/MDM: I have reviewed the triage vital signs and the nursing notes.   Pertinent labs & imaging results that were available during my care of the patient were reviewed by me and considered in my medical decision making (see chart for details).      I have reviewed her medical records including past results, notes and treatments. Medical, Surgical, and family history were reviewed.  Medications and recent lab tests were reviewed  Ordered repeat HCG.  Will check baseline Ultrasound to rule out ectopic.  US showed development of GS and possible yolk sac.  Discussed will need to repeat US for viability in 10 days.   Ectopic is effectively ruled out.    Also reviewed RH negative status. Does not need Rhophylac now but will if she bleeds later.   This bleeding/pain can represent  a normal pregnancy with bleeding, spontaneous abortion or even an ectopic which can be life-threatening.  The process as listed above helps to determine which of these is present.    ASSESSMENT Single IUP at [redacted] weeks gestation (approximate) Pelvic pain  PLAN Discharge home Supportive care until next ultrasound SAB precautions Has followup scheduled with provider  Pt stable at time of discharge. Encouraged to return here if she develops worsening of symptoms, increase in pain, fever, or other concerning symptoms.    Wynelle Bourgeois CNM, MSN Certified Nurse-Midwife 04/13/2023  9:26 PM

## 2023-04-13 NOTE — Telephone Encounter (Signed)
After Hours Line Call  Patient called, reports worsening pain and cramping. Given pregnancy of unknown location, recommend MAU evaluation.  Directions given.  Terisa Starr, MD  Family Medicine Teaching Service

## 2023-04-14 ENCOUNTER — Telehealth: Payer: Self-pay

## 2023-04-14 LAB — BETA HCG QUANT (REF LAB): hCG Quant: 2192 m[IU]/mL

## 2023-04-14 NOTE — Telephone Encounter (Signed)
Patient calls nurse line requesting to speak to [name] or PCP.   She is requesting a work excuse for 04/12/2023.  She reports she has not been feeling well "with everything going on."   She reports she would like to speak with PCP in regards to next steps from MAU visit yesterday.   Will forward to PCP and Elliot Gurney.

## 2023-04-15 ENCOUNTER — Encounter: Payer: Self-pay | Admitting: Student

## 2023-04-15 NOTE — Telephone Encounter (Signed)
Called patient after she had called the nursing line requesting to speak with me concerning recent lab and MAU visits.  Discussed patient's results with her and explained her most recent results show that she could be somewhere around 4-[redacted] weeks pregnant.  And her ultrasounds were generally unremarkable at this time most likely because it is too early to detect the entire uterine pregnancy at 5 weeks.  Also explained to patient that her ultrasound ruled out any ectopic pregnancy.  Did his best to get ultrasound after 8 weeks and patient verbalized understanding.  He also requested letter for work excuse for December 2 and 3.  Letter was drafted and sent to patient via MyChart.

## 2023-04-18 ENCOUNTER — Ambulatory Visit (HOSPITAL_COMMUNITY): Admission: RE | Admit: 2023-04-18 | Payer: Self-pay | Source: Ambulatory Visit

## 2023-04-28 ENCOUNTER — Emergency Department (HOSPITAL_BASED_OUTPATIENT_CLINIC_OR_DEPARTMENT_OTHER)
Admission: EM | Admit: 2023-04-28 | Discharge: 2023-04-28 | Disposition: A | Discharge status: Home / Self Care | Payer: Worker's Compensation | Attending: Emergency Medicine | Admitting: Emergency Medicine

## 2023-04-28 ENCOUNTER — Other Ambulatory Visit: Payer: Self-pay

## 2023-04-28 ENCOUNTER — Encounter (HOSPITAL_BASED_OUTPATIENT_CLINIC_OR_DEPARTMENT_OTHER): Payer: Self-pay | Admitting: Emergency Medicine

## 2023-04-28 ENCOUNTER — Ambulatory Visit (HOSPITAL_COMMUNITY): Payer: Self-pay

## 2023-04-28 DIAGNOSIS — R519 Headache, unspecified: Secondary | ICD-10-CM | POA: Diagnosis present

## 2023-04-28 DIAGNOSIS — E860 Dehydration: Secondary | ICD-10-CM | POA: Diagnosis not present

## 2023-04-28 DIAGNOSIS — Z79899 Other long term (current) drug therapy: Secondary | ICD-10-CM | POA: Insufficient documentation

## 2023-04-28 DIAGNOSIS — I959 Hypotension, unspecified: Secondary | ICD-10-CM | POA: Insufficient documentation

## 2023-04-28 LAB — CBC WITH DIFFERENTIAL/PLATELET
Abs Immature Granulocytes: 0.01 10*3/uL (ref 0.00–0.07)
Basophils Absolute: 0 10*3/uL (ref 0.0–0.1)
Basophils Relative: 0 %
Eosinophils Absolute: 0.1 10*3/uL (ref 0.0–0.5)
Eosinophils Relative: 2 %
HCT: 38.4 % (ref 36.0–46.0)
Hemoglobin: 12.3 g/dL (ref 12.0–15.0)
Immature Granulocytes: 0 %
Lymphocytes Relative: 30 %
Lymphs Abs: 1.3 10*3/uL (ref 0.7–4.0)
MCH: 28.4 pg (ref 26.0–34.0)
MCHC: 32 g/dL (ref 30.0–36.0)
MCV: 88.7 fL (ref 80.0–100.0)
Monocytes Absolute: 0.2 10*3/uL (ref 0.1–1.0)
Monocytes Relative: 5 %
Neutro Abs: 2.8 10*3/uL (ref 1.7–7.7)
Neutrophils Relative %: 63 %
Platelets: 263 10*3/uL (ref 150–400)
RBC: 4.33 MIL/uL (ref 3.87–5.11)
RDW: 14.5 % (ref 11.5–15.5)
WBC: 4.5 10*3/uL (ref 4.0–10.5)
nRBC: 0 % (ref 0.0–0.2)

## 2023-04-28 LAB — BASIC METABOLIC PANEL
Anion gap: 8 (ref 5–15)
BUN: 5 mg/dL — ABNORMAL LOW (ref 6–20)
CO2: 25 mmol/L (ref 22–32)
Calcium: 9.5 mg/dL (ref 8.9–10.3)
Chloride: 104 mmol/L (ref 98–111)
Creatinine, Ser: 0.62 mg/dL (ref 0.44–1.00)
GFR, Estimated: >60 mL/min (ref >60)
Glucose, Bld: 81 mg/dL (ref 70–99)
Potassium: 4.1 mmol/L (ref 3.5–5.1)
Sodium: 137 mmol/L (ref 135–145)

## 2023-04-28 LAB — HCG, QUANTITATIVE, PREGNANCY: hCG, Beta Chain, Quant, S: 407 m[IU]/mL — ABNORMAL HIGH (ref <5)

## 2023-04-28 MED ORDER — SODIUM CHLORIDE 0.9 % IV BOLUS
1000.0000 mL | Freq: Once | INTRAVENOUS | Status: AC
  Administered 2023-04-28: 1000 mL via INTRAVENOUS

## 2023-04-28 MED ORDER — KETOROLAC TROMETHAMINE 30 MG/ML IJ SOLN
30.0000 mg | Freq: Once | INTRAMUSCULAR | Status: DC
  Filled 2023-04-28: qty 1

## 2023-04-28 NOTE — Discharge Instructions (Addendum)
You need to follow up with the obgyn doctors to make sure your hormone level goes down to zero.

## 2023-04-28 NOTE — ED Provider Notes (Signed)
[place] EMERGENCY DEPARTMENT AT [facility] Provider Note   CSN: [id number] Arrival date & time: 04/28/23  0830     History  Chief Complaint  Patient presents with   Headache    [name] is a 22 y.o. female.  Pt is a 22 yo female with pmhx significant for migraines.  She said she's had a headache for 3 weeks.  She was dizzy at work about 2 weeks ago.  Bp was low, so she's not worked in 2 weeks.  She was seen at the MAU on 12/4 for an early pregnancy.  [name] did show a probably gestational sac, but recommended repeat quant.  Pt said she is not pregnant any more.  She had a surgical abortion on 12/14.  Pt denies any n/v.       Home Medications Prior to Admission medications   Medication Sig Start Date End Date Taking? Authorizing Provider  BIOTIN PO Take by mouth. Patient not taking: Reported on 12/29/2022    [provider]  cetirizine (ZYRTEC) 10 MG tablet Take 1 tablet (10 mg total) by mouth daily as needed for allergies. 12/21/18   Latrelle Dodrill, MD  ipratropium (ATROVENT) 0.06 % nasal spray Place 2 sprays into both nostrils 4 (four) times daily. 07/12/18   Cathie Hoops, Amy V, PA-C  lidocaine (LIDODERM) 5 % Place 1 patch onto the skin daily. Remove & Discard patch within 12 hours or as directed by MD 12/06/22   Blue, Soijett A, PA-C  methocarbamol (ROBAXIN) 500 MG tablet Take 1 tablet (500 mg total) by mouth 2 (two) times daily. 12/06/22   Blue, Soijett A, PA-C  metroNIDAZOLE (METROGEL) 0.75 % gel Apply 1 Application topically 2 (two) times a week. For the next 3 months 03/31/23   Elberta Fortis, MD  naproxen (NAPROSYN) 500 MG tablet Take 1 tablet (500 mg total) by mouth 2 (two) times daily with a meal. 02/08/22   Westley Chandler, MD  ondansetron (ZOFRAN) 4 MG tablet Take 1 tablet (4 mg total) by mouth every 8 (eight) hours as needed for nausea or vomiting. 09/03/22   Littie Deeds, MD  triamcinolone cream (KENALOG) 0.1 % Apply 1 Application topically 2 (two) times  daily. 12/29/22   Doreene Eland, MD  norelgestromin-ethinyl estradiol (ORTHO EVRA) 150-35 MCG/24HR transdermal patch Place 1 patch onto the skin once a week. 12/18/18 07/25/19  Mullis, Kiersten P, DO      Allergies    Penicillins, Amoxicillin, and Tramadol    Review of Systems   Review of Systems  Neurological:  Positive for headaches.  All other systems reviewed and are negative.   Physical Exam Updated Vital Signs BP 110/71 (BP Location: Right Arm)   Pulse 63   Temp 98.6 F (37 C)   Resp 16   LMP 04/24/2023   SpO2 100%   Breastfeeding Unknown  Physical Exam Vitals and nursing note reviewed.  Constitutional:      Appearance: She is well-developed.  HENT:     Head: Normocephalic and atraumatic.     Mouth/Throat:     Mouth: Mucous membranes are moist.     Pharynx: Oropharynx is clear.  Eyes:     Extraocular Movements: Extraocular movements intact.     Pupils: Pupils are equal, round, and reactive to light.  Cardiovascular:     Rate and Rhythm: Normal rate and regular rhythm.     Heart sounds: Normal heart sounds.  Pulmonary:     Effort: Pulmonary effort is normal.  Breath sounds: Normal breath sounds.  Abdominal:     General: Bowel sounds are normal.     Palpations: Abdomen is soft.  Musculoskeletal:        General: Normal range of motion.     Cervical back: Normal range of motion and neck supple.  Skin:    General: Skin is warm and dry.  Neurological:     Mental Status: She is alert and oriented to person, place, and time.  Psychiatric:        Mood and Affect: Mood normal.        Speech: Speech normal.        Behavior: Behavior normal.     ED Results / Procedures / Treatments   Labs (all labs ordered are listed, but only abnormal results are displayed) Labs Reviewed  BASIC METABOLIC PANEL - Abnormal; Notable for the following components:      Result Value   BUN 5 (*)    All other components within normal limits  HCG, QUANTITATIVE, PREGNANCY -  Abnormal; Notable for the following components:   hCG, Beta Chain, Quant, S 407 (*)    All other components within normal limits  CBC WITH DIFFERENTIAL/PLATELET  URINALYSIS, ROUTINE W REFLEX MICROSCOPIC    EKG None  Radiology No results found.  Procedures Procedures    Medications Ordered in ED Medications  ketorolac (TORADOL) 30 MG/ML injection 30 mg (30 mg Intravenous Patient Refused/Not Given 04/28/23 1051)  sodium chloride 0.9 % bolus 1,000 mL (0 mLs Intravenous Stopped 04/28/23 1113)    ED Course/ Medical Decision Making/ A&P                                 Medical Decision Making Amount and/or Complexity of Data Reviewed Labs: ordered.  Risk Prescription drug management.   This patient presents to the ED for concern of headache, this involves an extensive number of treatment options, and is a complaint that carries with it a high risk of complications and morbidity.  The differential diagnosis includes migraine, dehydration, pregnancy   Co morbidities that complicate the patient evaluation  migraines   Additional history obtained:  Additional history obtained from epic chart review  Lab Tests:  I Ordered, and personally interpreted labs.  The pertinent results include:  cbc nl, bmp nl, quant 407   Cardiac Monitoring:  The patient was maintained on a cardiac monitor.  I personally viewed and interpreted the cardiac monitored which showed an underlying rhythm of: nsr   Medicines ordered and prescription drug management:  I ordered medication including ivfs/toradol  for sx  Reevaluation of the patient after these medicines showed that the patient improved I have reviewed the patients home medicines and have made adjustments as needed   Critical Interventions:  Ivfs/meds   Problem List / ED Course:  Headache:  improved after ivfs Hypotension:  likely due to her recent pregnancy.  Hcg is coming down, but it is not zero.  Her procedure was on  the 14th.  Pt told to f/u with gyn to make sure it goes to zero.     Reevaluation:  After the interventions noted above, I reevaluated the patient and found that they have :improved   Social Determinants of Health:  Lives at home   Dispostion:  After consideration of the diagnostic results and the patients response to treatment, I feel that the patent would benefit from discharge with outpatient f/u.  Final Clinical Impression(s) / ED Diagnoses Final diagnoses:  Acute nonintractable headache, unspecified headache type  Dehydration    Rx / DC Orders ED Discharge Orders     None         Jacalyn Lefevre, MD 04/28/23 1134

## 2023-04-28 NOTE — ED Triage Notes (Signed)
Pt c/o "real bad HA" since April. "Neurologist on vacation". UC referred to ED. Taking gabapentin, helps "a little bit". Reports "getting dizzy at work x 2 weeks ago". Also reports hypotension. Gabapentin taken pta

## 2023-04-28 NOTE — ED Notes (Signed)
Discharge paperwork given and verbally understood. 

## 2023-04-28 NOTE — ED Notes (Signed)
Pt aware of the need for a urine... Unable to currently provide a sample... 

## 2023-05-03 NOTE — ED Notes (Signed)
Pt called today and stated that she needed a more detailed note from dr for light duty.  Pt states she has not returned to work yet she was seen on the 19th of December by [place].  Dr wrote an excuse for light duty at that time.  Dr Rhunette Croft is er dr today and states he cannot write anything different due to the time and not seeing patient so patient will have to be seen again with him if she wants a more detailefd note

## 2023-05-12 ENCOUNTER — Ambulatory Visit: Payer: Self-pay | Admitting: Family Medicine

## 2023-05-12 NOTE — Progress Notes (Deleted)
    SUBJECTIVE:   CHIEF COMPLAINT / HPI:   BV swab?  PERTINENT  PMH / PSH: ***  OBJECTIVE:   LMP 04/24/2023  ***  General: NAD, pleasant, able to participate in exam Cardiac: RRR, no murmurs. Respiratory: CTAB, normal effort, No wheezes, rales or rhonchi Abdomen: Bowel sounds present, nontender, nondistended Extremities: no edema or cyanosis. Skin: warm and dry, no rashes noted Neuro: alert, no obvious focal deficits Psych: Normal affect and mood  ASSESSMENT/PLAN:   No problem-specific Assessment & Plan notes found for this encounter.     Dr. Izetta Nap, DO [place] [facility]    {    This will disappear when note is signed, click to select method of visit    :1}

## 2023-05-14 ENCOUNTER — Ambulatory Visit
Admission: EM | Admit: 2023-05-14 | Discharge: 2023-05-14 | Disposition: A | Discharge status: Home / Self Care | Payer: Self-pay | Attending: Family Medicine | Admitting: Family Medicine

## 2023-05-14 DIAGNOSIS — B349 Viral infection, unspecified: Secondary | ICD-10-CM | POA: Insufficient documentation

## 2023-05-14 LAB — POCT INFLUENZA A/B
Influenza A, POC: NEGATIVE
Influenza B, POC: NEGATIVE

## 2023-05-14 MED ORDER — LOPERAMIDE HCL 2 MG PO CAPS
2.0000 mg | ORAL_CAPSULE | Freq: Two times a day (BID) | ORAL | 0 refills | Status: DC | PRN
Start: 2023-05-14 — End: 2023-09-30

## 2023-05-14 MED ORDER — ACETAMINOPHEN 325 MG PO TABS: 650.0000 mg | ORAL_TABLET | Freq: Four times a day (QID) | ORAL | 0 refills | Status: DC | PRN

## 2023-05-14 MED ORDER — ONDANSETRON 8 MG PO TBDP: 8.0000 mg | ORAL_TABLET | Freq: Three times a day (TID) | ORAL | 0 refills | Status: DC | PRN

## 2023-05-14 MED ORDER — ONDANSETRON 8 MG PO TBDP
8.0000 mg | ORAL_TABLET | Freq: Once | ORAL | Status: AC
  Administered 2023-05-14: 8 mg via ORAL

## 2023-05-14 NOTE — ED Provider Notes (Signed)
 [facility]  Note:  This document was prepared using Conservation officer, historic buildings and may include unintentional dictation errors.  MRN: [id number] DOB: Aug 05, 2000  Subjective:   [name] is a 23 y.o. female presenting for 1-day history of acute onset persistent malaise, fatigue, nausea, vomiting, diarrhea, headaches, body pains.  Has used multiple over-the-counter medication with minimal relief.  No overt chest pain, shortness of breath or wheezing.  No fever, bloody stools, recent antibiotic use, hospitalizations or long distance travel.  Has not eaten raw foods, drank unfiltered water .  No history of GI disorders including Crohn's, IBS, ulcerative colitis. Had an abortion in December 2024.   No current facility-administered medications for this encounter.  Current Outpatient Medications:    BIOTIN PO, Take by mouth. (Patient not taking: Reported on 12/29/2022), Disp: , Rfl:    cetirizine  (ZYRTEC ) 10 MG tablet, Take 1 tablet (10 mg total) by mouth daily as needed for allergies., Disp: 30 tablet, Rfl: 3   ipratropium (ATROVENT ) 0.06 % nasal spray, Place 2 sprays into both nostrils 4 (four) times daily., Disp: 15 mL, Rfl: 12   lidocaine  (LIDODERM ) 5 %, Place 1 patch onto the skin daily. Remove & Discard patch within 12 hours or as directed by MD, Disp: 30 patch, Rfl: 0   methocarbamol  (ROBAXIN ) 500 MG tablet, Take 1 tablet (500 mg total) by mouth 2 (two) times daily., Disp: 20 tablet, Rfl: 0   metroNIDAZOLE  (METROGEL ) 0.75 % gel, Apply 1 Application topically 2 (two) times a week. For the next 3 months, Disp: 45 g, Rfl: 2   naproxen  (NAPROSYN ) 500 MG tablet, Take 1 tablet (500 mg total) by mouth 2 (two) times daily with a meal., Disp: 30 tablet, Rfl: 0   ondansetron  (ZOFRAN ) 4 MG tablet, Take 1 tablet (4 mg total) by mouth every 8 (eight) hours as needed for nausea or vomiting., Disp: 20 tablet, Rfl: 0   triamcinolone  cream (KENALOG ) 0.1 %, Apply 1 Application  topically 2 (two) times daily., Disp: 30 g, Rfl: 0   Allergies  Allergen Reactions   Penicillins Rash   Amoxicillin    Tramadol      Past Medical History:  Diagnosis Date   Screen for STD (sexually transmitted disease) 02/28/2017   Seasonal allergies    Vaginal bleeding 09/17/2016   Vulvovaginal candidiasis 11/15/2014     Past Surgical History:  Procedure Laterality Date   APPENDECTOMY     LAPAROSCOPIC APPENDECTOMY N/A 11/08/2014   Procedure: APPENDECTOMY LAPAROSCOPIC;  Surgeon: Julietta Millman, MD;  Location: [facility];  Service: Pediatrics;  Laterality: N/A;    Family History  Problem Relation Age of Onset   Hypertension Mother    Heart disease Mother    Diabetes Sister    Asthma Sister    Diabetes Maternal Grandmother    Cancer Maternal Grandmother        Bladder   Hypertension Maternal Grandmother    Arthritis Maternal Grandmother    Arthritis Maternal Grandfather    Cancer Other        Maternal Great Aunt Breast Cancer    Social History   Tobacco Use   Smoking status: Never    Passive exposure: Never   Smokeless tobacco: Never  Vaping Use   Vaping status: Never Used  Substance Use Topics   Alcohol use: Yes    Comment: occ   Drug use: Never    ROS   Objective:   Vitals: BP 105/69 (BP Location: Right Arm)  Pulse (!) 107   Temp 98.7 F (37.1 C) (Oral)   Resp 16   Ht 5' (1.524 m)   Wt 115 lb (52.2 kg)   LMP 04/24/2023   SpO2 99%   Breastfeeding No   BMI 22.46 kg/m   Physical Exam Constitutional:      General: She is not in acute distress.    Appearance: Normal appearance. She is well-developed. She is not ill-appearing, toxic-appearing or diaphoretic.  HENT:     Head: Normocephalic and atraumatic.     Right Ear: External ear normal.     Left Ear: External ear normal.     Nose: Nose normal.     Mouth/Throat:     Mouth: Mucous membranes are moist.  Eyes:     General: No scleral icterus.       Right eye: No discharge.        Left eye: No  discharge.     Extraocular Movements: Extraocular movements intact.     Conjunctiva/sclera: Conjunctivae normal.  Cardiovascular:     Rate and Rhythm: Normal rate and regular rhythm.     Heart sounds: Normal heart sounds. No murmur heard.    No friction rub. No gallop.  Pulmonary:     Effort: Pulmonary effort is normal. No respiratory distress.     Breath sounds: No stridor. No wheezing, rhonchi or rales.  Chest:     Chest wall: No tenderness.  Abdominal:     General: Bowel sounds are normal. There is no distension.     Palpations: Abdomen is soft. There is no mass.     Tenderness: There is no abdominal tenderness. There is no right CVA tenderness, left CVA tenderness, guarding or rebound.  Skin:    General: Skin is warm and dry.  Neurological:     General: No focal deficit present.     Mental Status: She is alert and oriented to person, place, and time.  Psychiatric:        Mood and Affect: Mood normal.        Behavior: Behavior normal.        Thought Content: Thought content normal.        Judgment: Judgment normal.    Results for orders placed or performed during the hospital encounter of 05/14/23 (from the past 24 hours)  POCT Influenza A/B     Status: None   Collection Time: 05/14/23  3:00 PM  Result Value Ref Range   Influenza A, POC Negative Negative   Influenza B, POC Negative Negative   P.o. Zofran  8 mg given in clinic.  Assessment and Plan :   PDMP not reviewed this encounter.  1. Acute viral syndrome     Deferred UPT as patient just had an abortion.  Will manage for suspected viral gastroenteritis, an acute viral syndrome with supportive care.  Recommended patient hydrate well, eat light meals and maintain electrolytes.  Will use general supportive care, Zofran  and Imodium  for nausea, vomiting and diarrhea.  Low suspicion for an acute abdomen.  COVID testing pending.  Counseled patient on potential for adverse effects with medications prescribed/recommended  today, ER and return-to-clinic precautions discussed, patient verbalized understanding.    [name], [place] 05/14/23 [id number]

## 2023-05-14 NOTE — ED Triage Notes (Signed)
 Patient presents with symptoms of diarrhea, abdomen pain, hot and cold chills, body aches and nausea. Treated with pepto bismol and ginger ale. Symptoms started today.

## 2023-05-14 NOTE — Discharge Instructions (Addendum)
 We will manage this as a viral illness, viral syndrome. For sore throat or cough try using a honey-based tea. Use 3 teaspoons of honey with juice squeezed from half lemon. Place shaved pieces of ginger into 1/2-1 cup of water  and warm over stove top. Then mix the ingredients and repeat every 4 hours as needed. Please take ibuprofen  600mg  every 6 hours with food alternating with OR taken together with Tylenol  500mg -650mg  every 6 hours for throat pain, fevers, aches and pains. Hydrate very well with at least 2 liters of water . Eat light meals such as soups (chicken and noodles, vegetable, chicken and wild rice).  Do not eat foods that you are allergic to.  Taking an antihistamine like Zyrtec  (10mg  daily) can help against postnasal drainage, sinus congestion which can cause sinus pain, sinus headaches, throat pain, painful swallowing, coughing.  You can take this together with pseudoephedrine  (Sudafed) at a dose of 30-60 mg 3 times a day or twice daily as needed for the same kind of nasal drip, congestion.  Make sure you push fluids drinking mostly water  but mix it with Gatorade.  Try to eat light meals including soups, broths and soft foods, fruits.  You may use Zofran  for your nausea and vomiting once every 8 hours.  Imodium  can help with diarrhea but use this carefully limiting it to 1-2 times per day only if you are having a lot of diarrhea.  Please return to the clinic if symptoms worsen or you start having severe abdominal pain not helped by taking Tylenol  or start having bloody stools or blood in the vomit.

## 2023-05-15 LAB — SARS CORONAVIRUS 2 (TAT 6-24 HRS): SARS Coronavirus 2: NEGATIVE

## 2023-07-14 ENCOUNTER — Ambulatory Visit: Payer: Self-pay | Admitting: Family Medicine

## 2023-07-15 ENCOUNTER — Ambulatory Visit (INDEPENDENT_AMBULATORY_CARE_PROVIDER_SITE_OTHER): Payer: Self-pay | Admitting: Student

## 2023-07-15 ENCOUNTER — Other Ambulatory Visit (HOSPITAL_COMMUNITY)
Admission: RE | Admit: 2023-07-15 | Discharge: 2023-07-15 | Disposition: A | Discharge status: Home / Self Care | Payer: Self-pay | Source: Ambulatory Visit | Attending: Family Medicine | Admitting: Family Medicine

## 2023-07-15 VITALS — BP 102/65 | HR 87 | Ht 61.0 in | Wt 118.2 lb

## 2023-07-15 DIAGNOSIS — Z113 Encounter for screening for infections with a predominantly sexual mode of transmission: Secondary | ICD-10-CM | POA: Insufficient documentation

## 2023-07-15 DIAGNOSIS — N898 Other specified noninflammatory disorders of vagina: Secondary | ICD-10-CM

## 2023-07-15 DIAGNOSIS — Z7251 High risk heterosexual behavior: Secondary | ICD-10-CM

## 2023-07-15 LAB — POCT URINE PREGNANCY: Preg Test, Ur: NEGATIVE

## 2023-07-15 MED ORDER — NORELGESTROMIN-ETH ESTRADIOL 150-35 MCG/24HR TD PTWK: 1.0000 | MEDICATED_PATCH | TRANSDERMAL | 12 refills | Status: DC

## 2023-07-15 NOTE — Patient Instructions (Signed)
 [name],  Great to meet you. I am sending off your pap and testing for gonorrhea, chlamydia, and trich. We cannot test for HIV, syphilis, or hepatitis C without bloodwork.  As long as your pregnancy test is negative, I will send in some estrogen patches to your pharmacy. Let us know if these irritate your skin.  Eliezer Mccoy, MD

## 2023-07-16 NOTE — Progress Notes (Signed)
    SUBJECTIVE:   CHIEF COMPLAINT / HPI:   STI Testing Requests STI testing. She denies any symptoms. Sexually active with female partner, no condom, also not currently using contraception. Would be interested in contraception but only the patch at this time. She would only like vaginal swabs, declines serum testing for HIV, RPR, Hep C.  Due for pap and open to having this alongside STI testing. She is just coming off of her period.   OBJECTIVE:  Chaperone Dayshia Ottley CMA present  BP 102/65   Pulse 87   Ht 5\' 1"  (1.549 m)   Wt 53.6 kg   LMP 07/08/2023   SpO2 100%   BMI 22.33 kg/m   Gen: Well appearing and NAD Abd: Non-tender, non-distended, without suprapubic mass  GU: normal external genitalia, vagina without lesion, scant muddy brown discharge c/w resolving menses. Cervix without lesion.    ASSESSMENT/PLAN:   Assessment & Plan Unprotected sex STI testing offered per patient request. GC/Chlamydia/Trichomonas to be run off Pap sample. Advised that we can only test for HIV, Hep C, RPR with serum samples, but she again defers. UPT negative today and has not had sex since her last period.  - Pap Smear collected, will test for GC/Chlamydia/Trichomonas  - START Xulane patch once weekly  - Could discuss PrEP therapy with her if she is open to serum testing at a future visit    J Dorothyann Gibbs, MD [facility]

## 2023-07-20 LAB — CYTOLOGY - PAP
Chlamydia: NEGATIVE
Comment: NEGATIVE
Comment: NEGATIVE
Comment: NORMAL
Diagnosis: NEGATIVE
Neisseria Gonorrhea: NEGATIVE
Trichomonas: NEGATIVE

## 2023-07-21 ENCOUNTER — Encounter: Payer: Self-pay | Admitting: Student

## 2023-08-08 NOTE — Telephone Encounter (Signed)
 [name]

## 2023-08-20 DIAGNOSIS — Z419 Encounter for procedure for purposes other than remedying health state, unspecified: Secondary | ICD-10-CM | POA: Diagnosis not present

## 2023-09-06 ENCOUNTER — Other Ambulatory Visit (HOSPITAL_COMMUNITY)
Admission: RE | Admit: 2023-09-06 | Discharge: 2023-09-06 | Disposition: A | Discharge status: Home / Self Care | Payer: Self-pay | Source: Ambulatory Visit | Attending: Family Medicine | Admitting: Family Medicine

## 2023-09-06 ENCOUNTER — Encounter: Payer: Self-pay | Admitting: Student

## 2023-09-06 ENCOUNTER — Ambulatory Visit: Payer: Self-pay | Admitting: Student

## 2023-09-06 VITALS — BP 115/72 | HR 74 | Wt 116.4 lb

## 2023-09-06 DIAGNOSIS — Z113 Encounter for screening for infections with a predominantly sexual mode of transmission: Secondary | ICD-10-CM | POA: Insufficient documentation

## 2023-09-06 NOTE — Progress Notes (Signed)
    SUBJECTIVE:   CHIEF COMPLAINT / HPI:   [name] is a 23 y.o. female  presenting for   STI check - recently became sexually active with a new partner, wants to be checked for STIs.  - preferred gender of partner: Male - Contraception: Transdermal patch Symptoms include:  Lesion on her tongue, no systemic symptoms  PERTINENT  PMH / PSH: Reviewed and updated   OBJECTIVE:   BP 115/72   Pulse 74   Wt 116 lb 6.4 oz (52.8 kg)   LMP 02/22/2023   SpO2 96%   BMI 21.99 kg/m   Well-appearing, no acute distress Cardio: Regular rate, regular rhythm, no murmurs on exam. Pulm: Clear, no wheezing, no crackles. No increased work of breathing Abdominal: bowel sounds present, soft, non-tender, non-distended Extremities: no peripheral edema   Pelvic Exam: MA chaperone present  Normal external genitalia Discharge: No abnormal discharge      09/06/2023   10:54 AM 07/15/2023    2:05 PM 04/11/2023    1:42 PM  PHQ9 SCORE ONLY  PHQ-9 Total Score 0 0 0      ASSESSMENT/PLAN:   Assessment & Plan Routine screening for STI (sexually transmitted infection)  GC testing collected.  RPR and HIV ordered.  Birthcontrol and safe sex practices discussed with patient.  PREP discussed and offered to patient.   Clem Currier, DO [facility]

## 2023-09-06 NOTE — Patient Instructions (Signed)
 It was great to see you today!   Today we addressed: You will be scheduled for a lab appointment. I will send you a MyChart message with your results.   No future appointments.  Please arrive 15 minutes before your appointment to ensure smooth check in process.    Please call the clinic at [phone] if your symptoms worsen or you have any concerns.  Thank you for allowing me to participate in your care, Dr. Clem Currier [facility]

## 2023-09-07 ENCOUNTER — Other Ambulatory Visit: Payer: Self-pay

## 2023-09-07 LAB — CERVICOVAGINAL ANCILLARY ONLY
Chlamydia: NEGATIVE
Chlamydia: NEGATIVE
Comment: NEGATIVE
Comment: NORMAL
Comment: NEGATIVE
Comment: NEGATIVE
Comment: NORMAL
Neisseria Gonorrhea: NEGATIVE
Neisseria Gonorrhea: NEGATIVE
Trichomonas: NEGATIVE

## 2023-09-08 ENCOUNTER — Encounter: Payer: Self-pay | Admitting: Student

## 2023-09-19 DIAGNOSIS — Z419 Encounter for procedure for purposes other than remedying health state, unspecified: Secondary | ICD-10-CM | POA: Diagnosis not present

## 2023-09-30 ENCOUNTER — Ambulatory Visit (HOSPITAL_COMMUNITY): Admission: EM | Admit: 2023-09-30 | Discharge: 2023-09-30 | Disposition: A | Discharge status: Home / Self Care

## 2023-09-30 ENCOUNTER — Other Ambulatory Visit: Payer: Self-pay

## 2023-09-30 DIAGNOSIS — Z113 Encounter for screening for infections with a predominantly sexual mode of transmission: Secondary | ICD-10-CM | POA: Diagnosis not present

## 2023-09-30 DIAGNOSIS — N760 Acute vaginitis: Secondary | ICD-10-CM | POA: Insufficient documentation

## 2023-09-30 DIAGNOSIS — Z3202 Encounter for pregnancy test, result negative: Secondary | ICD-10-CM | POA: Diagnosis not present

## 2023-09-30 LAB — HIV ANTIBODY (ROUTINE TESTING W REFLEX): HIV Screen 4th Generation wRfx: NONREACTIVE

## 2023-09-30 LAB — POCT URINE PREGNANCY: Preg Test, Ur: NEGATIVE

## 2023-09-30 NOTE — ED Triage Notes (Signed)
 PT reports sh ehas BV. Pt has a vaginal odor

## 2023-09-30 NOTE — ED Provider Notes (Signed)
 [facility]  Note:  This document was prepared using Dragon voice recognition software and may include unintentional dictation errors.  MRN: [id number] DOB: Mar 11, 2001  Subjective:   [name] is a 23 y.o. female presenting for STD testing secondary to vaginal odor similar to previous episodes of bacterial vaginosis that she has had in the past.  Patient denies any dysuria, increased urinary frequency, abdominal pain, flank pain.  Patient denies any known exposure to any STDs.  Patient denies any vaginal lesion or significant vaginal discharge.  Patient also requesting testing for HIV and syphilis while here in urgent care for full screening exam.  Patient denies any other secondary medical concerns at this time.  No current facility-administered medications for this encounter.  Current Outpatient Medications:    BIOTIN PO, Take by mouth. (Patient not taking: Reported on 12/29/2022), Disp: , Rfl:    norelgestromin -ethinyl estradiol  (XULANE) 150-35 MCG/24HR transdermal patch, Place 1 patch onto the skin once a week., Disp: 3 patch, Rfl: 12   Allergies  Allergen Reactions   Penicillins Rash   Amoxicillin    Tramadol      Past Medical History:  Diagnosis Date   Screen for STD (sexually transmitted disease) 02/28/2017   Seasonal allergies    Vaginal bleeding 09/17/2016   Vulvovaginal candidiasis 11/15/2014     Past Surgical History:  Procedure Laterality Date   APPENDECTOMY     LAPAROSCOPIC APPENDECTOMY N/A 11/08/2014   Procedure: APPENDECTOMY LAPAROSCOPIC;  Surgeon: Alanda Allegra, MD;  Location: [facility] OR;  Service: Pediatrics;  Laterality: N/A;    Family History  Problem Relation Age of Onset   Hypertension Mother    Heart disease Mother    Diabetes Sister    Asthma Sister    Diabetes Maternal Grandmother    Cancer Maternal Grandmother        Bladder   Hypertension Maternal Grandmother    Arthritis Maternal Grandmother    Arthritis Maternal Grandfather     Cancer Other        Maternal Great Aunt Breast Cancer    Social History   Tobacco Use   Smoking status: Never    Passive exposure: Never   Smokeless tobacco: Never  Vaping Use   Vaping status: Never Used  Substance Use Topics   Alcohol use: Yes    Comment: occ   Drug use: Never    ROS Refer to HPI for ROS details.  Objective:   Vitals: BP 104/70   Pulse 78   Temp 98.8 F (37.1 C)   Resp 18   LMP 09/03/2023   SpO2 98%   Physical Exam Vitals and nursing note reviewed.  Constitutional:      General: She is not in acute distress.    Appearance: She is well-developed. She is not ill-appearing or toxic-appearing.  HENT:     Head: Normocephalic and atraumatic.  Cardiovascular:     Rate and Rhythm: Normal rate.  Pulmonary:     Effort: Pulmonary effort is normal. No respiratory distress.  Abdominal:     General: Bowel sounds are normal. There is no distension.     Palpations: Abdomen is soft.     Tenderness: There is no abdominal tenderness. There is no right CVA tenderness, left CVA tenderness, guarding or rebound.  Skin:    General: Skin is warm and dry.  Neurological:     General: No focal deficit present.     Mental Status: She is alert and oriented to person, place, and time.  Psychiatric:  Mood and Affect: Mood normal.        Behavior: Behavior normal.     Procedures  Results for orders placed or performed during the hospital encounter of 09/30/23 (from the past 24 hours)  POCT urine pregnancy     Status: None   Collection Time: 09/30/23  5:40 PM  Result Value Ref Range   Preg Test, Ur Negative Negative    No results found.   Assessment and Plan :     Discharge Instructions       1. Acute vaginitis (Primary) - POCT urine pregnancy completed in UC is negative - Cervicovaginal swab collected in UC and sent to lab for further testing results should be available in 2 to 3 days.  2. Screening for STD (sexually transmitted disease) - HIV  Antibody (routine testing w rflx) & RPR collected in UC and sent to lab for further testing results should be available in 1 to 2 days. -Continue to monitor symptoms for any change in severity if there is any escalation of current symptoms or development of new symptoms follow-up in ER for further evaluation and management.    Kaine Mcquillen B [place] Tapp   Shanera Meske B, [place] 09/30/23 1745

## 2023-09-30 NOTE — Discharge Instructions (Signed)
  1. Acute vaginitis (Primary) - POCT urine pregnancy completed in UC is negative - Cervicovaginal swab collected in UC and sent to lab for further testing results should be available in 2 to 3 days.  2. Screening for STD (sexually transmitted disease) - HIV Antibody (routine testing w rflx) & RPR collected in UC and sent to lab for further testing results should be available in 1 to 2 days. -Continue to monitor symptoms for any change in severity if there is any escalation of current symptoms or development of new symptoms follow-up in ER for further evaluation and management.

## 2023-10-01 ENCOUNTER — Ambulatory Visit (HOSPITAL_COMMUNITY): Payer: Self-pay

## 2023-10-01 LAB — RPR: RPR Ser Ql: NONREACTIVE

## 2023-10-04 LAB — CERVICOVAGINAL ANCILLARY ONLY
Bacterial Vaginitis (gardnerella): NEGATIVE
Candida Glabrata: NEGATIVE
Candida Vaginitis: NEGATIVE
Chlamydia: NEGATIVE
Comment: NEGATIVE
Comment: NEGATIVE
Comment: NEGATIVE
Comment: NEGATIVE
Comment: NEGATIVE
Comment: NORMAL
Neisseria Gonorrhea: NEGATIVE
Trichomonas: NEGATIVE

## 2023-10-20 DIAGNOSIS — Z419 Encounter for procedure for purposes other than remedying health state, unspecified: Secondary | ICD-10-CM | POA: Diagnosis not present

## 2023-10-28 ENCOUNTER — Ambulatory Visit (INDEPENDENT_AMBULATORY_CARE_PROVIDER_SITE_OTHER): Admitting: Family Medicine

## 2023-10-28 ENCOUNTER — Ambulatory Visit: Payer: Self-pay | Admitting: Family Medicine

## 2023-10-28 ENCOUNTER — Other Ambulatory Visit (HOSPITAL_COMMUNITY)
Admission: RE | Admit: 2023-10-28 | Discharge: 2023-10-28 | Disposition: A | Discharge status: Home / Self Care | Source: Ambulatory Visit | Attending: Family Medicine | Admitting: Family Medicine

## 2023-10-28 VITALS — BP 100/71 | HR 75 | Ht 61.0 in | Wt 114.8 lb

## 2023-10-28 DIAGNOSIS — L309 Dermatitis, unspecified: Secondary | ICD-10-CM

## 2023-10-28 LAB — POCT WET PREP (WET MOUNT)
Clue Cells Wet Prep Whiff POC: NEGATIVE
Trichomonas Wet Prep HPF POC: ABSENT

## 2023-10-28 NOTE — Progress Notes (Unsigned)
    SUBJECTIVE:   CHIEF COMPLAINT / HPI:   ***  [name] is a 23yo F that pf skin bumps  - Reports new skin bumps on her buttocks (not involving anus) and some on her inner R thigh that appeared 2 days ago. - Last shaved 4 days ago - No dysuria, vaginal discharge, fevers - Has had a new sexual partner. - Patient is interested in STI testing - Patient reports that she has sensitive skin and thinks that this could be due to irritation but wanted to make sure it was not something more serious.  PERTINENT  PMH / PSH: ***  OBJECTIVE:   BP 100/71   Pulse 75   Ht 5' 1 (1.549 m)   Wt 114 lb 12.8 oz (52.1 kg)   LMP 10/02/2023   SpO2 100%   BMI 21.69 kg/m   ***  Mildy red bump on inner R thigh. No vesicles or ulceration.   ASSESSMENT/PLAN:   Assessment & Plan Dermatitis    Suspect folliculitis   Albin Huh, MD [facility]

## 2023-10-28 NOTE — Patient Instructions (Signed)
 Good to see you today - Thank you for coming in  Things we discussed today:  1) Your symptoms are most likely folliculitis, which is irritation of hair follicles. This can be due to chafing, tight clothing, new detergents or soaps, warm weather. - We will check for STIs. We will reach out with any abnormal results.

## 2023-10-30 NOTE — Assessment & Plan Note (Signed)
 Suspect folliculitis in setting of warm weather, shaving. Less likely STI, but will screen given recent new partner. - GC/Ch, wet mount

## 2023-10-31 LAB — CERVICOVAGINAL ANCILLARY ONLY
Chlamydia: NEGATIVE
Comment: NEGATIVE
Comment: NORMAL
Neisseria Gonorrhea: NEGATIVE

## 2023-11-14 ENCOUNTER — Ambulatory Visit

## 2023-11-14 VITALS — BP 99/72 | HR 80 | Ht 61.0 in | Wt 115.4 lb

## 2023-11-14 DIAGNOSIS — N898 Other specified noninflammatory disorders of vagina: Secondary | ICD-10-CM | POA: Diagnosis not present

## 2023-11-14 NOTE — Patient Instructions (Signed)
 It was wonderful to see you today.  Please bring ALL of your medications with you to every visit.   Today a wet prep was done to evaluate for BV/yeast infection. We will call with results.  Thank you for choosing [facility].   Please call [phone] with any questions about today's appointment.  Please arrive at least 15 minutes prior to your scheduled appointments.   If you had blood work today, I will send you a MyChart message or a letter if results are normal. Otherwise, I will give you a call.   If you had a referral placed, they will call you to set up an appointment. Please give us  a call if you don't hear back in the next 2 weeks.   If you need additional refills before your next appointment, please call your pharmacy first.   You should follow up in our clinic in as needed.  Camie Dixons, DO Family Medicine

## 2023-11-14 NOTE — Progress Notes (Cosign Needed Addendum)
    SUBJECTIVE:   CHIEF COMPLAINT / HPI:   [name] is a 23 year old female who presents for evaluation of possible bacterial vaginosis.  Patient reports 2 weeks of white, thick discharge and foul odor.  She has not had the symptoms before and was diagnosed with BV.  She denies any dysuria, hematuria, suprapubic pain, or any other complaints at this time. She does report 1 new sexual partner and denies use of protection.    OBJECTIVE:   LMP 10/31/2023  General: A&O, NAD, pleasant HEENT: No sign of trauma, EOM grossly intact Cardiac: RRR, no m/r/g Respiratory: CTAB, normal WOB, no w/c/r GI: Soft, NTTP, non-distended, BS present, no suprapubic tenderness Extremities: NTTP, no peripheral edema. Neuro: Normal gait, moves all four extremities appropriately. Psych: Appropriate mood and affect  GU: chaperone present, presence of thick, white discharge along vaginal walls and surrounding cervical os  ASSESSMENT/PLAN:   Assessment & Plan Vaginal discharge Symptoms ongoing for 2 weeks now, similar to last diagnosis of BV.  +1 new sexual partner recently, denies use of protection. Wet prep sent out to [facility], will message patient with results. -If positive, will prescribe 500 mg metronidazole  twice daily for 7 days. -Return to clinic as needed for follow-up.   Camie Dixons, DO [facility]

## 2023-11-15 MED ORDER — METRONIDAZOLE 500 MG PO TABS: 500.0000 mg | ORAL_TABLET | Freq: Two times a day (BID) | ORAL | 0 refills | Status: DC

## 2023-11-19 DIAGNOSIS — Z419 Encounter for procedure for purposes other than remedying health state, unspecified: Secondary | ICD-10-CM | POA: Diagnosis not present

## 2023-12-20 DIAGNOSIS — Z419 Encounter for procedure for purposes other than remedying health state, unspecified: Secondary | ICD-10-CM | POA: Diagnosis not present

## 2023-12-21 ENCOUNTER — Ambulatory Visit (INDEPENDENT_AMBULATORY_CARE_PROVIDER_SITE_OTHER): Admitting: Family Medicine

## 2023-12-21 ENCOUNTER — Other Ambulatory Visit (HOSPITAL_COMMUNITY)
Admission: RE | Admit: 2023-12-21 | Discharge: 2023-12-21 | Disposition: A | Discharge status: Home / Self Care | Source: Ambulatory Visit | Attending: Family Medicine | Admitting: Family Medicine

## 2023-12-21 VITALS — BP 97/67 | HR 79 | Ht 60.0 in | Wt 114.2 lb

## 2023-12-21 DIAGNOSIS — Z113 Encounter for screening for infections with a predominantly sexual mode of transmission: Secondary | ICD-10-CM

## 2023-12-21 LAB — POCT WET PREP (WET MOUNT)
Clue Cells Wet Prep Whiff POC: NEGATIVE
Trichomonas Wet Prep HPF POC: ABSENT

## 2023-12-21 NOTE — Patient Instructions (Addendum)
 All you know if any results from today come back abnormal or require treatment.  Otherwise I will send you message on MyChart  Please let us  know if you develop any fevers, severe abdominal pain, nausea or vomiting, or abnormal vaginal discharge or bleeding  I recommend considering options for contraception such as IUD, pills, Nexplanon, or Depo injections.  Please let us  know if you decide on any of these  In the meantime I recommend continuing use of condoms regularly as these can help prevent STD transmission and unwanted pregnancies

## 2023-12-21 NOTE — Progress Notes (Signed)
    SUBJECTIVE:   CHIEF COMPLAINT / HPI:   STD testing Denies significant abdominal pain, fevers, vaginal discharge, bleeding Declines HIV/RPR testing States that she occasionally uses condoms but will try to do this more often.  Does not currently want to try any other forms of contraception but states she will review options and let us  know if she wants to do any   PERTINENT  PMH / PSH: N/A  OBJECTIVE:   BP 97/67   Pulse 79   Ht 5' (1.524 m)   Wt 114 lb 3.2 oz (51.8 kg)   LMP 11/29/2023   SpO2 100%   BMI 22.30 kg/m   General: NAD, pleasant, able to participate in exam Respiratory: No respiratory distress Skin: warm and dry, no rashes noted Psych: Normal affect and mood Abdomen nontender, nondistended, soft  Pelvic exam: normal external genitalia, vulva, vagina, cervix, uterus and adnexa, exam chaperoned by Thersia CMA.  ASSESSMENT/PLAN:   Assessment & Plan Routine screening for STI (sexually transmitted infection) Exam unremarkable Wet mount, GC swabs done today, discussed condom use and contraception options, patient to let us  know if she decides on anything Discussed return precautions  Wet mount unremarkable, follow-up remainder of results   Payton Coward, MD [facility]

## 2023-12-21 NOTE — Assessment & Plan Note (Addendum)
 Exam unremarkable Wet mount, GC swabs done today, discussed condom use and contraception options, patient to let us  know if she decides on anything Discussed return precautions  Wet mount unremarkable, follow-up remainder of results

## 2023-12-23 ENCOUNTER — Ambulatory Visit: Payer: Self-pay | Admitting: Family Medicine

## 2023-12-23 LAB — CERVICOVAGINAL ANCILLARY ONLY
Chlamydia: NEGATIVE
Comment: NEGATIVE
Comment: NEGATIVE
Comment: NORMAL
Neisseria Gonorrhea: NEGATIVE
Trichomonas: NEGATIVE

## 2024-01-03 ENCOUNTER — Ambulatory Visit (INDEPENDENT_AMBULATORY_CARE_PROVIDER_SITE_OTHER): Admitting: Family Medicine

## 2024-01-03 ENCOUNTER — Encounter: Payer: Self-pay | Admitting: Family Medicine

## 2024-01-03 VITALS — BP 96/78 | HR 78 | Ht 60.0 in | Wt 112.6 lb

## 2024-01-03 DIAGNOSIS — R1084 Generalized abdominal pain: Secondary | ICD-10-CM | POA: Diagnosis not present

## 2024-01-03 DIAGNOSIS — Z30016 Encounter for initial prescription of transdermal patch hormonal contraceptive device: Secondary | ICD-10-CM

## 2024-01-03 LAB — POCT URINE PREGNANCY: Preg Test, Ur: NEGATIVE

## 2024-01-03 LAB — POCT URINALYSIS DIP (MANUAL ENTRY)
Bilirubin, UA: NEGATIVE
Blood, UA: NEGATIVE
Glucose, UA: NEGATIVE mg/dL
Ketones, POC UA: NEGATIVE mg/dL
Leukocytes, UA: NEGATIVE
Nitrite, UA: NEGATIVE
Protein Ur, POC: NEGATIVE mg/dL
Spec Grav, UA: 1.02 (ref 1.010–1.025)
Urobilinogen, UA: 0.2 U/dL
pH, UA: 7 (ref 5.0–8.0)

## 2024-01-03 MED ORDER — NORELGESTROMIN-ETH ESTRADIOL 150-35 MCG/24HR TD PTWK: 1.0000 | MEDICATED_PATCH | TRANSDERMAL | 12 refills | Status: DC

## 2024-01-03 NOTE — Patient Instructions (Signed)
 It was wonderful to see you today! Thank you for choosing [facility].   Please bring ALL of your medications with you to every visit.   Today we talked about:  For your abdominal pain we are going to get some lab work today and an ultrasound that can be done at [facility].  This is to look to see if you have any gallbladder symptoms that could be causing your pain.  It is possible you have IBS as well as this can often cause crampy prolonged pain but we should rule out other causes. I did refill the contraception patches that should be available at your pharmacy.  These are covered under Medicaid and usually $4.  Please follow up in 2 weeks if not improved   We are checking some labs today. If they are abnormal, I will call you. If they are normal, I will send you a MyChart message (if it is active) or a letter in the mail. If you do not hear about your labs in the next 2 weeks, please call the office.  Call the clinic at [phone] if your symptoms worsen or you have any concerns.  Please be sure to schedule follow up at the front desk before you leave today.   Izetta Nap, DO Family Medicine

## 2024-01-03 NOTE — Progress Notes (Unsigned)
    SUBJECTIVE:   CHIEF COMPLAINT / HPI:   Abdominal pain X 2 months. Just off her cycle but still hurting. Worse after eating. Has not taken anything. Entire stomach. Denies dysuria and hematuria. Pooped the last two days. Denies fever. Denies N/V. Denies burning or reflux like symptoms. Took pregnancy test and it was negative. No vaginal discharge. Still has gallbladder.     PERTINENT  PMH / PSH: ***  OBJECTIVE:   BP 96/78   Pulse 78   Ht 5' (1.524 m)   Wt 112 lb 9.6 oz (51.1 kg)   LMP 12/26/2023   SpO2 99%   BMI 21.99 kg/m  ***  General: NAD, pleasant, able to participate in exam Cardiac: RRR, no murmurs. Respiratory: CTAB, normal effort, No wheezes, rales or rhonchi Abdomen: Bowel sounds present, nontender, nondistended Extremities: no edema or cyanosis. Skin: warm and dry, no rashes noted Neuro: alert, no obvious focal deficits Psych: Normal affect and mood  ASSESSMENT/PLAN:   No problem-specific Assessment & Plan notes found for this encounter.     Dr. Izetta Nap, DO [place] [facility]    {    This will disappear when note is signed, click to select method of visit    :1}

## 2024-01-04 LAB — COMPREHENSIVE METABOLIC PANEL WITH GFR
ALT: 7 IU/L (ref 0–32)
AST: 16 IU/L (ref 0–40)
Albumin: 4.6 g/dL (ref 4.0–5.0)
Alkaline Phosphatase: 52 IU/L (ref 44–121)
BUN/Creatinine Ratio: 14 (ref 9–23)
BUN: 10 mg/dL (ref 6–20)
Bilirubin Total: 0.4 mg/dL (ref 0.0–1.2)
CO2: 22 mmol/L (ref 20–29)
Calcium: 10 mg/dL (ref 8.7–10.2)
Chloride: 103 mmol/L (ref 96–106)
Creatinine, Ser: 0.71 mg/dL (ref 0.57–1.00)
Globulin, Total: 2.8 g/dL (ref 1.5–4.5)
Glucose: 73 mg/dL (ref 70–99)
Potassium: 4.4 mmol/L (ref 3.5–5.2)
Sodium: 140 mmol/L (ref 134–144)
Total Protein: 7.4 g/dL (ref 6.0–8.5)
eGFR: 122 mL/min/1.73 (ref >59)

## 2024-01-04 LAB — CBC WITH DIFFERENTIAL/PLATELET
Basophils Absolute: 0 x10E3/uL (ref 0.0–0.2)
Basos: 1 %
EOS (ABSOLUTE): 0 x10E3/uL (ref 0.0–0.4)
Eos: 1 %
Hematocrit: 40.7 % (ref 34.0–46.6)
Hemoglobin: 13.1 g/dL (ref 11.1–15.9)
Immature Grans (Abs): 0 x10E3/uL (ref 0.0–0.1)
Immature Granulocytes: 0 %
Lymphocytes Absolute: 1.2 x10E3/uL (ref 0.7–3.1)
Lymphs: 27 %
MCH: 28.9 pg (ref 26.6–33.0)
MCHC: 32.2 g/dL (ref 31.5–35.7)
MCV: 90 fL (ref 79–97)
Monocytes Absolute: 0.3 x10E3/uL (ref 0.1–0.9)
Monocytes: 7 %
Neutrophils Absolute: 2.9 x10E3/uL (ref 1.4–7.0)
Neutrophils: 63 %
Platelets: 281 x10E3/uL (ref 150–450)
RBC: 4.53 x10E6/uL (ref 3.77–5.28)
RDW: 13.9 % (ref 11.7–15.4)
WBC: 4.4 x10E3/uL (ref 3.4–10.8)

## 2024-01-04 LAB — LIPASE: Lipase: 32 U/L (ref 14–72)

## 2024-01-04 NOTE — Assessment & Plan Note (Signed)
 Nonfocal pattern of pain and exam reassuring against acute abdomen.  Unclear etiology, possibly gallbladder vs IBS vs constipation.  Per chart review appears to have similar symptoms intermittently, possibly fits more of an IBS-C picture.  Urine pregnancy and UA negative and STI testing negative in the past 2 weeks.  Will rule out secondary causes and if reassuring will treat symptomatically. -CBC, CMP, lipase -RUQ US  -If workup reassuringly normal, will consider IBS-C management

## 2024-01-04 NOTE — Assessment & Plan Note (Signed)
 Agreeable to restart contraception, just finished her menstrual cycle also recently sure not pregnant. -Xulane patch weekly

## 2024-01-06 ENCOUNTER — Ambulatory Visit: Payer: Self-pay | Admitting: Family Medicine

## 2024-01-10 ENCOUNTER — Ambulatory Visit (HOSPITAL_BASED_OUTPATIENT_CLINIC_OR_DEPARTMENT_OTHER): Admission: RE | Admit: 2024-01-10 | Source: Ambulatory Visit

## 2024-01-13 ENCOUNTER — Other Ambulatory Visit: Payer: Self-pay

## 2024-01-13 ENCOUNTER — Ambulatory Visit: Admitting: Family Medicine

## 2024-01-13 ENCOUNTER — Other Ambulatory Visit (HOSPITAL_COMMUNITY)
Admission: RE | Admit: 2024-01-13 | Discharge: 2024-01-13 | Disposition: A | Discharge status: Home / Self Care | Source: Ambulatory Visit | Attending: Family Medicine | Admitting: Family Medicine

## 2024-01-13 ENCOUNTER — Emergency Department (HOSPITAL_COMMUNITY)
Admission: EM | Admit: 2024-01-13 | Discharge: 2024-01-13 | Discharge status: Against Medical Advice | Source: Ambulatory Visit | Attending: Emergency Medicine | Admitting: Emergency Medicine

## 2024-01-13 ENCOUNTER — Encounter (HOSPITAL_COMMUNITY): Payer: Self-pay

## 2024-01-13 ENCOUNTER — Ambulatory Visit
Admission: RE | Admit: 2024-01-13 | Discharge: 2024-01-13 | Disposition: A | Discharge status: Home / Self Care | Source: Ambulatory Visit | Attending: Family Medicine | Admitting: Family Medicine

## 2024-01-13 ENCOUNTER — Telehealth: Payer: Self-pay

## 2024-01-13 ENCOUNTER — Encounter (HOSPITAL_BASED_OUTPATIENT_CLINIC_OR_DEPARTMENT_OTHER): Payer: Self-pay

## 2024-01-13 VITALS — BP 104/63 | HR 96 | Temp 98.2°F | Ht 61.0 in | Wt 113.8 lb

## 2024-01-13 DIAGNOSIS — R1084 Generalized abdominal pain: Secondary | ICD-10-CM | POA: Diagnosis not present

## 2024-01-13 DIAGNOSIS — N750 Cyst of Bartholin's gland: Secondary | ICD-10-CM

## 2024-01-13 DIAGNOSIS — N739 Female pelvic inflammatory disease, unspecified: Secondary | ICD-10-CM | POA: Insufficient documentation

## 2024-01-13 DIAGNOSIS — Z113 Encounter for screening for infections with a predominantly sexual mode of transmission: Secondary | ICD-10-CM

## 2024-01-13 DIAGNOSIS — R109 Unspecified abdominal pain: Secondary | ICD-10-CM

## 2024-01-13 DIAGNOSIS — Z5321 Procedure and treatment not carried out due to patient leaving prior to being seen by health care provider: Secondary | ICD-10-CM | POA: Insufficient documentation

## 2024-01-13 DIAGNOSIS — N898 Other specified noninflammatory disorders of vagina: Secondary | ICD-10-CM | POA: Diagnosis not present

## 2024-01-13 LAB — POCT WET PREP (WET MOUNT)
Clue Cells Wet Prep Whiff POC: POSITIVE
Trichomonas Wet Prep HPF POC: ABSENT

## 2024-01-13 LAB — COMPREHENSIVE METABOLIC PANEL WITH GFR
ALT: 8 U/L (ref 0–44)
AST: 17 U/L (ref 15–41)
Albumin: 4.7 g/dL (ref 3.5–5.0)
Alkaline Phosphatase: 58 U/L (ref 38–126)
Anion gap: 13 (ref 5–15)
BUN: 15 mg/dL (ref 6–20)
CO2: 23 mmol/L (ref 22–32)
Calcium: 10.1 mg/dL (ref 8.9–10.3)
Chloride: 103 mmol/L (ref 98–111)
Creatinine, Ser: 0.78 mg/dL (ref 0.44–1.00)
GFR, Estimated: >60 mL/min (ref >60)
Glucose, Bld: 76 mg/dL (ref 70–99)
Potassium: 3.9 mmol/L (ref 3.5–5.1)
Sodium: 139 mmol/L (ref 135–145)
Total Bilirubin: 0.2 mg/dL (ref 0.0–1.2)
Total Protein: 8.5 g/dL — ABNORMAL HIGH (ref 6.5–8.1)

## 2024-01-13 LAB — CBC
HCT: 36.8 % (ref 36.0–46.0)
Hemoglobin: 12 g/dL (ref 12.0–15.0)
MCH: 28.1 pg (ref 26.0–34.0)
MCHC: 32.6 g/dL (ref 30.0–36.0)
MCV: 86.2 fL (ref 80.0–100.0)
Platelets: 266 K/uL (ref 150–400)
RBC: 4.27 MIL/uL (ref 3.87–5.11)
RDW: 14.2 % (ref 11.5–15.5)
WBC: 6.9 K/uL (ref 4.0–10.5)
nRBC: 0 % (ref 0.0–0.2)

## 2024-01-13 LAB — URINALYSIS, ROUTINE W REFLEX MICROSCOPIC
Bilirubin Urine: NEGATIVE
Glucose, UA: NEGATIVE mg/dL
Hgb urine dipstick: NEGATIVE
Nitrite: NEGATIVE
Specific Gravity, Urine: 1.035 — ABNORMAL HIGH (ref 1.005–1.030)
pH: 5.5 (ref 5.0–8.0)

## 2024-01-13 LAB — LIPASE, BLOOD: Lipase: 37 U/L (ref 11–51)

## 2024-01-13 LAB — PREGNANCY, URINE: Preg Test, Ur: NEGATIVE

## 2024-01-13 MED ORDER — METRONIDAZOLE 500 MG PO TABS: 2000.0000 mg | ORAL_TABLET | Freq: Once | ORAL | 0 refills | Status: AC

## 2024-01-13 NOTE — ED Notes (Signed)
 Pt. Called for urine sample, no answer

## 2024-01-13 NOTE — Telephone Encounter (Signed)
 Patient calls nurse line reporting she left the ED.   She reports the wait time was too long and she reports she was in too much pain.   She reports she came home and took something. She reports she feels much better.   She reports she does plan to return to the ED, however a different location.  Suggested [facility] location.   Patient was appreciative.

## 2024-01-13 NOTE — Progress Notes (Signed)
    SUBJECTIVE:   CHIEF COMPLAINT / HPI:   MH is a 23yo F that pf vaginal discharge.  - Reports she is having greenish tan vaginal discharge for the past 1 day. - Also has a boil on her R labia for 3 days. Then yesterday it popped, and pus came out. The boil feels tender.  - Yesterday, started to have vaginal discharge. It looked like cottage cheese initially, smelled malodorous. - Then today, the discharge is more watery and now greenish/tan color - Reports that about 30 min ago, she tried to have BM and reports that since then she has been having acute lower abm cramping. It feels like cramps in her uterus and in her bottom.  - Denies any known STI contacts - Currently sexually active - Sometimes uses condoms, but inconsistently.  - LMP 8/18-23. Gets regular monthly periods.  - No diarrhea, vomiting, dysuria, fevers.  - Had RUQ US  this morning.   PERTINENT  PMH / PSH: recurrent yeast infxn  OBJECTIVE:   BP 104/63   Pulse 96   Temp 98.2 F (36.8 C)   Ht 5' 1 (1.549 m)   Wt 113 lb 12.8 oz (51.6 kg)   LMP 12/26/2023   SpO2 100%   BMI 21.50 kg/m   General: Alert, uncomfortable-appearing but nontoxic woman. NAD. HEENT: NCAT. MMM. CV: RRR, no murmurs. Resp: CTAB, no wheezing or crackles. Normal WOB on RA.  Abm: Tender to palpation in lower abm/suprapubic region. No rebound, regidity, or guarding. Pt having difficulty walking due to her pain. GU: - Greenish, white vaginal discharge. Cervix with diffuse small red punctations, strawberry cervix appearance.  - Tender boil on R lower labia majora, without active drainage or bleeding - Cervical motion tenderness on exam  Shelly CMA was present as chaperone  ASSESSMENT/PLAN:   Assessment & Plan Abdominal pain, unspecified abdominal location Differential includes PID, ectopic pregnancy, ovarian abscess/cyst, appendicitis. Reassuringly VSS and afebrile and no peritoneal signs on exam. Given exquisite tenderness and risk  factors for ectopic and PID, advised pt to go to ED for emergent evaluation. Offered ambulance transportation, but pt prefers to have mom come to drive her. Given stable vitals, I think this is reasonable.  - f/u wet prep, GC/Ch, trich - At ED, consider Upreg (workup ectopic), CTAP/US  (workup potential appendicitis, PID, ovarian cyst/abscess), UA, STI testing Vaginal discharge Discharge and cervical exam most cw trichomonas.  - Empirically tx trichomonas with 2g flagyl  for one dose. - f/u STI testing as above Bartholin cyst Boil on labia most cw bartholin cyst, which has popped. Advised supportive management and return precautions.    Twyla Nearing, MD [facility]

## 2024-01-13 NOTE — ED Notes (Signed)
 Pt refused labs.

## 2024-01-13 NOTE — ED Triage Notes (Signed)
 Was seen at [facility] for abd pain and sent to the ED for further eval.  Pain started today. LMP 08/18

## 2024-01-13 NOTE — ED Triage Notes (Signed)
 Pt c/o abd pain just keeps going, been all day. Feels like shocks. States pain started after bowel movement. LMP 8/18. Concerned for STD, seen for same & advised to come to ED for further eval

## 2024-01-13 NOTE — Patient Instructions (Addendum)
 I am worried that you may have a more serious cause for your abdominal pain such as pelvic inflammatory disease, ectopic pregnancy, or an abscess. Please go to the ED immediately to get imaging and a pregnancy test.  1) Your vaginal discharge is likely due to trichomonas.  - Take metronidazole  2000mg  (4 tablets) as a single dose.  - Encourage your sexual partners to get tested as well - Avoid intercourse for the next 5 days  2) The cyst on your labia is most likely a bartholin cyst. They usually heal after they pop, but can take time for the swelling to go down. Let us  know if the boil starts to grow again.

## 2024-01-14 ENCOUNTER — Emergency Department (HOSPITAL_BASED_OUTPATIENT_CLINIC_OR_DEPARTMENT_OTHER)
Admission: EM | Admit: 2024-01-14 | Discharge: 2024-01-14 | Disposition: A | Discharge status: Home / Self Care | Source: Ambulatory Visit | Attending: Emergency Medicine | Admitting: Emergency Medicine

## 2024-01-14 ENCOUNTER — Emergency Department (HOSPITAL_BASED_OUTPATIENT_CLINIC_OR_DEPARTMENT_OTHER)

## 2024-01-14 DIAGNOSIS — R109 Unspecified abdominal pain: Secondary | ICD-10-CM | POA: Diagnosis not present

## 2024-01-14 DIAGNOSIS — N730 Acute parametritis and pelvic cellulitis: Secondary | ICD-10-CM

## 2024-01-14 DIAGNOSIS — R102 Pelvic and perineal pain: Secondary | ICD-10-CM | POA: Diagnosis not present

## 2024-01-14 DIAGNOSIS — D259 Leiomyoma of uterus, unspecified: Secondary | ICD-10-CM | POA: Diagnosis not present

## 2024-01-14 LAB — WET PREP, GENITAL
Sperm: NONE SEEN
Trich, Wet Prep: NONE SEEN
WBC, Wet Prep HPF POC: <10 (ref <10)

## 2024-01-14 MED ORDER — DOXYCYCLINE HYCLATE 100 MG PO CAPS: 100.0000 mg | ORAL_CAPSULE | Freq: Two times a day (BID) | ORAL | 0 refills | Status: DC

## 2024-01-14 MED ORDER — IOHEXOL 300 MG/ML  SOLN
75.0000 mL | Freq: Once | INTRAMUSCULAR | Status: AC | PRN
  Administered 2024-01-14: 70 mL via INTRAVENOUS

## 2024-01-14 MED ORDER — STERILE WATER FOR INJECTION IJ SOLN
INTRAMUSCULAR | Status: AC
  Administered 2024-01-14: 10 mL
  Filled 2024-01-14: qty 10

## 2024-01-14 MED ORDER — FLUCONAZOLE 150 MG PO TABS
150.0000 mg | ORAL_TABLET | Freq: Once | ORAL | Status: AC
  Administered 2024-01-14: 150 mg via ORAL
  Filled 2024-01-14: qty 1

## 2024-01-14 MED ORDER — OXYCODONE-ACETAMINOPHEN 5-325 MG PO TABS: 1.0000 | ORAL_TABLET | Freq: Four times a day (QID) | ORAL | 0 refills | Status: DC | PRN

## 2024-01-14 MED ORDER — ONDANSETRON HCL 4 MG/2ML IJ SOLN
4.0000 mg | Freq: Once | INTRAMUSCULAR | Status: AC
  Administered 2024-01-14: 4 mg via INTRAVENOUS
  Filled 2024-01-14: qty 2

## 2024-01-14 MED ORDER — METRONIDAZOLE 500 MG PO TABS
500.0000 mg | ORAL_TABLET | Freq: Once | ORAL | Status: AC
  Administered 2024-01-14: 500 mg via ORAL
  Filled 2024-01-14: qty 1

## 2024-01-14 MED ORDER — LACTATED RINGERS IV BOLUS
1000.0000 mL | Freq: Once | INTRAVENOUS | Status: AC
  Administered 2024-01-14: 1000 mL via INTRAVENOUS

## 2024-01-14 MED ORDER — DOXYCYCLINE HYCLATE 100 MG PO TABS
100.0000 mg | ORAL_TABLET | Freq: Once | ORAL | Status: AC
  Administered 2024-01-14: 100 mg via ORAL
  Filled 2024-01-14: qty 1

## 2024-01-14 MED ORDER — FENTANYL CITRATE PF 50 MCG/ML IJ SOSY
50.0000 ug | PREFILLED_SYRINGE | Freq: Once | INTRAMUSCULAR | Status: AC
  Administered 2024-01-14: 50 ug via INTRAVENOUS
  Filled 2024-01-14: qty 1

## 2024-01-14 MED ORDER — CEFTRIAXONE SODIUM 500 MG IJ SOLR
500.0000 mg | Freq: Once | INTRAMUSCULAR | Status: AC
  Administered 2024-01-14: 500 mg via INTRAMUSCULAR
  Filled 2024-01-14: qty 500

## 2024-01-14 MED ORDER — METRONIDAZOLE 500 MG PO TABS: 500.0000 mg | ORAL_TABLET | Freq: Two times a day (BID) | ORAL | 0 refills | Status: DC

## 2024-01-14 NOTE — Discharge Instructions (Addendum)
 Take the antibiotics as prescribed.  You were treated for bacterial vaginosis, yeast infection and pelvic inflammatory disease.  Your sexual partner should be treated as well.  Follow-up with your doctor.  Return to the ED with new or worsening symptoms.  Your US  revealed: IMPRESSION:  1. No evidence of ovarian torsion.  2. 4.1 cm complex right ovarian cyst is noted suggesting hemorrhagic  cyst. Short-interval follow up ultrasound in 6-12 weeks is  recommended, preferably during the week following the patient's  normal menses.

## 2024-01-14 NOTE — ED Provider Notes (Signed)
  Physical Exam  BP 113/73 (BP Location: Right Arm)   Pulse 63   Temp 98.2 F (36.8 C)   Resp 18   LMP 12/26/2023   SpO2 100%   Physical Exam  Procedures  Procedures  ED Course / MDM    Medical Decision Making Amount and/or Complexity of Data Reviewed Labs: ordered. Radiology: ordered.  Risk Prescription drug management.   85F presenting with abd pain, being treated for PID, waiting for an US . DC if normal with tx for PID.    US : IMPRESSION:  1. No evidence of ovarian torsion.  2. 4.1 cm complex right ovarian cyst is noted suggesting hemorrhagic  cyst. Short-interval follow up ultrasound in 6-12 weeks is  recommended, preferably during the week following the patient's  normal menses.   Patient reassessed, feeling symptomatically improved but still endorsing pain, has to drive herself home, ok with picking up pain medication and taking once she gets home, will discharge with treatment for PID.   Jerrol Agent, MD 01/14/24 [phone]

## 2024-01-14 NOTE — ED Provider Notes (Signed)
  EMERGENCY DEPARTMENT AT [facility] Provider Note   CSN: [id number] Arrival date & time: 01/13/24  2122     Patient presents with: Abdominal Pain   [name] is a 23 y.o. female.   Patient with lower abdominal pain intermittently for the past 1 day.  She describes shocks that involve her entire lower abdomen that come and go lasting for several minutes at a time.  Associated with urinary frequency, urgency, greenish-yellow vaginal discharge.  She saw PCP today for similar symptoms and was referred to the ED for CT scan.  She had a pelvic exam in the office that was concerning for bacterial vaginosis and yeast infection.  She was sent to the ED given the degree of discomfort she had was not able to give a urine sample in the office.  She denies any nausea or vomiting.  Denies any diarrhea.  Denies any abnormal vaginal bleeding or discharge.  Feels pain across her entire lower abdomen associate with some vaginal discharge and dysuria.  Still has gallbladder but no appendix.  The history is provided by the patient.  Abdominal Pain Associated symptoms: dysuria, nausea and vaginal discharge   Associated symptoms: no cough, no fever, no shortness of breath and no vomiting        Prior to Admission medications   Medication Sig Start Date End Date Taking? Authorizing Provider  BIOTIN PO Take by mouth. Patient not taking: Reported on 12/29/2022    [provider]  norelgestromin -ethinyl estradiol  (XULANE) 150-35 MCG/24HR transdermal patch Place 1 patch onto the skin once a week. 01/03/24   Theophilus Pagan, MD    Allergies: Penicillins, Amoxicillin, and Tramadol     Review of Systems  Constitutional:  Positive for activity change and appetite change. Negative for fever.  HENT:  Negative for congestion and rhinorrhea.   Respiratory:  Negative for cough, chest tightness and shortness of breath.   Gastrointestinal:  Positive for abdominal pain and nausea.  Negative for vomiting.  Genitourinary:  Positive for dysuria, flank pain, pelvic pain, urgency and vaginal discharge.  Musculoskeletal:  Negative for arthralgias.  Neurological:  Negative for weakness and headaches.   all other systems are negative except as noted in the HPI and PMH.    Updated Vital Signs BP 110/72   Pulse 64   Temp 98.2 F (36.8 C)   Resp 16   LMP 12/26/2023   SpO2 100%   Physical Exam Vitals and nursing note reviewed.  Constitutional:      General: She is not in acute distress.    Appearance: She is well-developed.  HENT:     Head: Normocephalic and atraumatic.     Mouth/Throat:     Pharynx: No oropharyngeal exudate.  Eyes:     Conjunctiva/sclera: Conjunctivae normal.     Pupils: Pupils are equal, round, and reactive to light.  Neck:     Comments: No meningismus. Cardiovascular:     Rate and Rhythm: Normal rate and regular rhythm.     Heart sounds: Normal heart sounds. No murmur heard. Pulmonary:     Effort: Pulmonary effort is normal. No respiratory distress.     Breath sounds: Normal breath sounds.  Abdominal:     Palpations: Abdomen is soft.     Tenderness: There is abdominal tenderness. There is no guarding or rebound.     Comments: Diffuse tenderness, no guarding or rebound  Genitourinary:    Comments: Chaperone present.  Normal external genitalia.  White discharge in vaginal vault.  Positive CMT lateralized adnexal tenderness right greater than left. Musculoskeletal:        General: No tenderness. Normal range of motion.     Cervical back: Normal range of motion and neck supple.     Comments: No CVA tenderness  Skin:    General: Skin is warm.  Neurological:     Mental Status: She is alert and oriented to person, place, and time.     Cranial Nerves: No cranial nerve deficit.     Motor: No abnormal muscle tone.     Coordination: Coordination normal.     Comments:  5/5 strength throughout. CN 2-12 intact.Equal grip strength.   Psychiatric:         Behavior: Behavior normal.     (all labs ordered are listed, but only abnormal results are displayed) Labs Reviewed  WET PREP, GENITAL - Abnormal; Notable for the following components:      Result Value   Yeast Wet Prep HPF POC PRESENT (*)    Clue Cells Wet Prep HPF POC PRESENT (*)    All other components within normal limits  COMPREHENSIVE METABOLIC PANEL WITH GFR - Abnormal; Notable for the following components:   Total Protein 8.5 (*)    All other components within normal limits  URINALYSIS, ROUTINE W REFLEX MICROSCOPIC - Abnormal; Notable for the following components:   Specific Gravity, Urine 1.035 (*)    Ketones, ur TRACE (*)    Protein, ur TRACE (*)    Leukocytes,Ua MODERATE (*)    Bacteria, UA RARE (*)    All other components within normal limits  LIPASE, BLOOD  CBC  PREGNANCY, URINE  GC/CHLAMYDIA PROBE AMP ([place]) NOT AT [facility]    EKG: None  Radiology: CT ABDOMEN PELVIS W CONTRAST Result Date: 01/14/2024 EXAM: CT ABDOMEN AND PELVIS WITH CONTRAST 01/14/2024 03:53:48 AM TECHNIQUE: CT of the abdomen and pelvis was performed with the administration of 70mL iohexol  (OMNIPAQUE ) 300 MG/ML solution. Multiplanar reformatted images are provided for review. Automated exposure control, iterative reconstruction, and/or weight-based adjustment of the mA/kV was utilized to reduce the radiation dose to as low as reasonably achievable. COMPARISON: CT 11/19/2014 CLINICAL HISTORY: Abdominal pain, acute, nonlocalized. c/o abd pain just keeps going, been all day. Feels like shocks. States pain started after bowel movement. FINDINGS: LOWER CHEST: No acute abnormality. LIVER: The liver is unremarkable. GALLBLADDER AND BILE DUCTS: Gallbladder is unremarkable. No biliary ductal dilatation. SPLEEN: No acute abnormality. PANCREAS: No acute abnormality. ADRENAL GLANDS: No acute abnormality. KIDNEYS, URETERS AND BLADDER: No stones in the kidneys or ureters. No hydronephrosis. No perinephric  or periureteral stranding. Urinary bladder is unremarkable. GI AND BOWEL: Stomach demonstrates no acute abnormality. There is no bowel obstruction. PERITONEUM AND RETROPERITONEUM: Small volume low-density free fluid in the pelvis is likely physiologic. No free intraperitoneal air. VASCULATURE: Aorta is normal in caliber. LYMPH NODES: No lymphadenopathy. REPRODUCTIVE ORGANS: 4.2 cm low-density cyst in the right adnexa. According to the Ovarian-Adnexal Reporting and Data System Ultrasound (O-RADS US ), the finding is consistent with O-RADS US  2 (almost certainly benign, <1% risk of malignancy). No follow up recommended. Exophytic fibroid along the inferior left uterus. BONES AND SOFT TISSUES: No acute osseous abnormality. No focal soft tissue abnormality. IMPRESSION: 1. No acute findings in the abdomen or pelvis. Electronically signed by: Norman Gatlin MD 01/14/2024 04:03 AM EDT RP Workstation: [id number]     Procedures   Medications Ordered in the ED  iohexol  (OMNIPAQUE ) 300 MG/ML solution 75 mL (has no administration in time  range)  ondansetron  (ZOFRAN ) injection 4 mg (4 mg Intravenous Given 01/14/24 0333)  fentaNYL  (SUBLIMAZE ) injection 50 mcg (50 mcg Intravenous Given 01/14/24 0334)  lactated ringers  bolus 1,000 mL (1,000 mLs Intravenous New Bag/Given 01/14/24 9667)                                    Medical Decision Making Amount and/or Complexity of Data Reviewed Labs: ordered. Decision-making details documented in ED Course. Radiology: ordered and independent interpretation performed. Decision-making details documented in ED Course. ECG/medicine tests: ordered and independent interpretation performed. Decision-making details documented in ED Course.  Risk Prescription drug management.   Abdominal pain with vaginal discharge and dysuria.  Stable vitals.  Abdomen soft but diffusely tender lower quadrants  Per PCP visit earlier today she was positive for bacterial vaginosis and yeast  Exam  as above.  No leukocytosis.  hCG is negative.  Urinalysis is negative. CT scan is negative for acute surgical findings. Does show a large right ovarian cyst.  Uterine fibroid.  Normal appendix  Will treat for PID with IM rocephin , PO doxycycline  and PO flagyl . Also diflucan  for yeast.  UA with moderate LE. Negative nitrite.   Symptoms appear consistent with PID. She is quite uncomfortable on pelvic exam. Given large R ovarian cyst wih r/o abscess or ovarian torsion.  US  pending at shift change.  Advised her sexual partner should be treated as well.   Dr. Jerrol to assume care.     Final diagnoses:  PID (acute pelvic inflammatory disease)    ED Discharge Orders     None          Netasha Wehrli, Garnette, MD 01/14/24 [phone]

## 2024-01-16 ENCOUNTER — Ambulatory Visit: Payer: Self-pay | Admitting: Family Medicine

## 2024-01-16 LAB — GC/CHLAMYDIA PROBE AMP (~~LOC~~) NOT AT ARMC
Chlamydia: NEGATIVE
Comment: NEGATIVE
Comment: NORMAL
Neisseria Gonorrhea: NEGATIVE

## 2024-01-16 LAB — CERVICOVAGINAL ANCILLARY ONLY
Chlamydia: NEGATIVE
Comment: NEGATIVE
Comment: NEGATIVE
Comment: NORMAL
Neisseria Gonorrhea: NEGATIVE
Trichomonas: NEGATIVE

## 2024-01-16 NOTE — Telephone Encounter (Signed)
 Called pt to update them on test results. Pt reports that she is doing better and is at work currently. Reports that she is taking the antibiotics for PID, BV, and yeast from ED. Additionally she reports that the pain meds from the ED are a little too strong for her, and if there are alternatives.   From chart review, it looks like pt has a R ovarian cyst c/f hemorrhagic cyst that was noted on US  on in ED. Her STI test came back negative for trich, GC/Ch. Wet prep notable for BV, yeast. It is most likely that she had vaginal irritation from BV,  yeast and then had acute abm pain from an ovarian cyst. I have lower concern for true PID given negative testing.  - Advised pt to f/u in 2 wks w/ PCP. Repeat US  is recommended in 6-12 wks to monitor ovarian cyst

## 2024-01-20 ENCOUNTER — Telehealth: Payer: Self-pay

## 2024-01-20 DIAGNOSIS — Z419 Encounter for procedure for purposes other than remedying health state, unspecified: Secondary | ICD-10-CM | POA: Diagnosis not present

## 2024-01-20 MED ORDER — FLUCONAZOLE 150 MG PO TABS: 150.0000 mg | ORAL_TABLET | Freq: Once | ORAL | 0 refills | Status: AC

## 2024-01-20 MED ORDER — MICONAZOLE NITRATE 2 % VA CREA: 1.0000 | TOPICAL_CREAM | Freq: Every day | VAGINAL | 0 refills | Status: DC

## 2024-01-20 NOTE — Telephone Encounter (Signed)
 Patient calls nurse line requesting Diflucan .  She reports she has completed several doses of antibiotics over the last week. She reports she has 3 doses of doxycycline  left.   She reports she now has cottage cheese itchy discharge. She reports irritation on the outside as well.  She is requesting the oral Diflucan  and a cream to help the outside if appropriate.  She denies and fevers or odors.   Advised will forward to PCP.

## 2024-01-20 NOTE — Telephone Encounter (Signed)
 Rx sent in, please let patient know Michele JINNY Legions, MD

## 2024-01-23 NOTE — Telephone Encounter (Signed)
 Called patient and informed. Patient reports that she was able to pick up fluconazole  tablets over the weekend,however, was unable to pick up cream.   Advised patient that this is likely due to cream being over the counter. Patient asked that I call pharmacy to confirm this.   Called pharmacy. They verified that miconazole  cream is OTC and they are unable to bill insurance for OTC products. Patient will need to pick up OTC.   Called patient back and advised of update.   Chiquita JAYSON English, RN

## 2024-01-27 ENCOUNTER — Ambulatory Visit: Admitting: Family Medicine

## 2024-02-02 ENCOUNTER — Ambulatory Visit
Admission: RE | Admit: 2024-02-02 | Discharge: 2024-02-02 | Disposition: A | Discharge status: Home / Self Care | Source: Ambulatory Visit | Attending: Family Medicine | Admitting: Family Medicine

## 2024-02-02 ENCOUNTER — Other Ambulatory Visit: Payer: Self-pay

## 2024-02-02 VITALS — BP 118/80 | HR 85 | Temp 99.0°F | Resp 16

## 2024-02-02 DIAGNOSIS — Z113 Encounter for screening for infections with a predominantly sexual mode of transmission: Secondary | ICD-10-CM | POA: Insufficient documentation

## 2024-02-02 DIAGNOSIS — N898 Other specified noninflammatory disorders of vagina: Secondary | ICD-10-CM | POA: Insufficient documentation

## 2024-02-02 MED ORDER — METRONIDAZOLE 500 MG PO TABS: 500.0000 mg | ORAL_TABLET | Freq: Two times a day (BID) | ORAL | 0 refills | Status: DC

## 2024-02-02 NOTE — ED Triage Notes (Signed)
 Pt c/o white vaginal discharge, vaginal odor started yesterday
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
## 2024-02-02 NOTE — Discharge Instructions (Addendum)
 My clinic will contact you with results of the STD testing/vaginal swab done today if positive.  A prescription for metronidazole  has been sent to your pharmacy, please do not take until your results are available and indicate that you do have BV.  Please follow-up with your PCP or gynecologist if your symptoms are not improving.  Please go to the ER for any worsening symptoms.  Hope you feel better soon!

## 2024-02-03 ENCOUNTER — Ambulatory Visit (HOSPITAL_COMMUNITY): Payer: Self-pay

## 2024-02-03 LAB — CERVICOVAGINAL ANCILLARY ONLY
Bacterial Vaginitis (gardnerella): POSITIVE — AB
Candida Glabrata: NEGATIVE
Candida Vaginitis: POSITIVE — AB
Chlamydia: NEGATIVE
Comment: NEGATIVE
Comment: NEGATIVE
Comment: NEGATIVE
Comment: NEGATIVE
Comment: NEGATIVE
Comment: NORMAL
Neisseria Gonorrhea: NEGATIVE
Trichomonas: NEGATIVE

## 2024-02-03 LAB — HIV ANTIBODY (ROUTINE TESTING W REFLEX): HIV Screen 4th Generation wRfx: NONREACTIVE

## 2024-02-03 LAB — RPR: RPR Ser Ql: NONREACTIVE

## 2024-02-03 MED ORDER — FLUCONAZOLE 150 MG PO TABS: 150.0000 mg | ORAL_TABLET | Freq: Once | ORAL | 0 refills | Status: AC

## 2024-02-09 ENCOUNTER — Telehealth: Payer: Self-pay

## 2024-02-09 NOTE — Telephone Encounter (Signed)
 Pt calls nurse line regarding follow up from UC visit on 9/25.  She was seen in UC on 9/25 and was diagnosed with BV and yeast infection. Started metronidazole  pills on 9/25 and then diflucan  on 9/26, she took second dose of diflucan  today. She also took boric acid suppository today.   She reports that symptoms had improved, however, had intercourse again yesterday and feels that symptoms are coming back. She feels that she is noticing recurrent BV happening after intercourse with her partner.  She is noticing more vaginal discharge and vaginal odor. Reports discharge has a tan/mucus like appearance.   She reports LMP of 9/14.  She is asking how she should proceed given recurrent BV infections and if she should receive additional treatment.   Advised that I would reach out to provider, however, she may need to schedule follow up office visit.   Patient voices understanding.   Chiquita JAYSON English, RN

## 2024-02-10 NOTE — Telephone Encounter (Signed)
 Would recommend she come in for office visit to discuss. It may be worth treating her partner as well - there is emerging data that treating female partners is helpful for recurrent BV. This would need to be a discussion with her and ideally her partner also.  Please inform patient, thanks!  Laymon JINNY Legions, MD

## 2024-02-13 NOTE — Telephone Encounter (Signed)
 Attempted to call patient, however no answer.   Will await her return call to discuss and schedule an in person visit.

## 2024-02-13 NOTE — Telephone Encounter (Signed)
 Patient returns call to nurse line.   Scheduled for tomorrow afternoon with Dr. Theophilus, as PCP does not have availability until next month.   Michele JAYSON English, RN

## 2024-02-13 NOTE — Progress Notes (Deleted)
    SUBJECTIVE:   CHIEF COMPLAINT / HPI:   Recurrent BV  PERTINENT  PMH / PSH: ***  OBJECTIVE:   LMP 01/22/2024  ***  General: NAD, pleasant, able to participate in exam Cardiac: RRR, no murmurs. Respiratory: CTAB, normal effort, No wheezes, rales or rhonchi Abdomen: Bowel sounds present, nontender, nondistended Extremities: no edema or cyanosis. Skin: warm and dry, no rashes noted Neuro: alert, no obvious focal deficits Psych: Normal affect and mood  ASSESSMENT/PLAN:   No problem-specific Assessment & Plan notes found for this encounter.     Dr. Izetta Nap, DO [facility]    {    This will disappear when note is signed, click to select method of visit    :1}

## 2024-02-14 ENCOUNTER — Ambulatory Visit: Admitting: Family Medicine

## 2024-02-14 DIAGNOSIS — N898 Other specified noninflammatory disorders of vagina: Secondary | ICD-10-CM

## 2024-02-15 ENCOUNTER — Ambulatory Visit

## 2024-03-12 ENCOUNTER — Ambulatory Visit
Admission: EM | Admit: 2024-03-12 | Discharge: 2024-03-12 | Disposition: A | Discharge status: Home / Self Care | Attending: Family Medicine | Admitting: Family Medicine

## 2024-03-12 DIAGNOSIS — N898 Other specified noninflammatory disorders of vagina: Secondary | ICD-10-CM | POA: Insufficient documentation

## 2024-03-12 DIAGNOSIS — Z113 Encounter for screening for infections with a predominantly sexual mode of transmission: Secondary | ICD-10-CM | POA: Insufficient documentation

## 2024-03-12 MED ORDER — METRONIDAZOLE 500 MG PO TABS: 500.0000 mg | ORAL_TABLET | Freq: Two times a day (BID) | ORAL | 0 refills | Status: DC

## 2024-03-12 MED ORDER — FLUCONAZOLE 150 MG PO TABS: 150.0000 mg | ORAL_TABLET | ORAL | 0 refills | Status: DC

## 2024-03-12 NOTE — Discharge Instructions (Signed)
 Start metronidazole  for BV and fluconazole  for yeast infection. We will update you on your test results tomorrow.

## 2024-03-12 NOTE — ED Provider Notes (Signed)
 [facility]  Note:  This document was prepared using Conservation officer, historic buildings and may include unintentional dictation errors.  MRN: [id number] DOB: 06/14/2000  Subjective:   [name] is a 23 y.o. female presenting for 2-day history of recurrent vaginal discharge with a strong odor and vaginal irritation, itching.  Patient would like empiric treatment for BV and yeast infection.  Has a history of both.  Would also like STI testing except for blood work.  Denies fever, n/v, abdominal pain, pelvic pain, rashes, dysuria, urinary frequency, hematuria.    No current facility-administered medications for this encounter.  Current Outpatient Medications:    BIOTIN PO, Take by mouth. (Patient not taking: Reported on 12/29/2022), Disp: , Rfl:    cetirizine  (ZYRTEC ) 10 MG tablet, Take 10 mg by mouth daily., Disp: , Rfl:    metroNIDAZOLE  (FLAGYL ) 500 MG tablet, Take 1 tablet (500 mg total) by mouth 2 (two) times daily., Disp: 14 tablet, Rfl: 0   norelgestromin -ethinyl estradiol  (XULANE) 150-35 MCG/24HR transdermal patch, Place 1 patch onto the skin once a week., Disp: 3 patch, Rfl: 12   Allergies  Allergen Reactions   Penicillins Rash   Amoxicillin    Tramadol      Past Medical History:  Diagnosis Date   Screen for STD (sexually transmitted disease) 02/28/2017   Seasonal allergies    Vaginal bleeding 09/17/2016   Vulvovaginal candidiasis 11/15/2014     Past Surgical History:  Procedure Laterality Date   APPENDECTOMY     LAPAROSCOPIC APPENDECTOMY N/A 11/08/2014   Procedure: APPENDECTOMY LAPAROSCOPIC;  Surgeon: Julietta Millman, MD;  Location: [facility] OR;  Service: Pediatrics;  Laterality: N/A;    Family History  Problem Relation Age of Onset   Hypertension Mother    Heart disease Mother    Diabetes Sister    Asthma Sister    Diabetes Maternal Grandmother    Cancer Maternal Grandmother        Bladder   Hypertension Maternal Grandmother    Arthritis  Maternal Grandmother    Arthritis Maternal Grandfather    Cancer Other        Maternal Great Aunt Breast Cancer    Social History   Tobacco Use   Smoking status: Never    Passive exposure: Never   Smokeless tobacco: Never  Vaping Use   Vaping status: Never Used  Substance Use Topics   Alcohol use: Yes    Comment: occ   Drug use: Never    ROS   Objective:   Vitals: BP 113/68 (BP Location: Left Arm)   Pulse 68   Temp 98.5 F (36.9 C) (Oral)   Resp 14   LMP 02/21/2023 (Exact Date)   SpO2 99%   Physical Exam Constitutional:      General: She is not in acute distress.    Appearance: Normal appearance. She is well-developed. She is not ill-appearing, toxic-appearing or diaphoretic.  HENT:     Head: Normocephalic and atraumatic.     Nose: Nose normal.     Mouth/Throat:     Mouth: Mucous membranes are moist.  Eyes:     General: No scleral icterus.       Right eye: No discharge.        Left eye: No discharge.     Extraocular Movements: Extraocular movements intact.  Cardiovascular:     Rate and Rhythm: Normal rate.  Pulmonary:     Effort: Pulmonary effort is normal.  Skin:    General: Skin is  warm and dry.  Neurological:     General: No focal deficit present.     Mental Status: She is alert and oriented to person, place, and time.  Psychiatric:        Mood and Affect: Mood normal.        Behavior: Behavior normal.     Assessment and Plan :   PDMP not reviewed this encounter.  1. Vaginal discharge   2. Screen for STD (sexually transmitted disease)    We will treat patient empirically for bacterial vaginosis with Flagyl  and for yeast vaginitis with fluconazole .  Labs pending. Counseled patient on potential for adverse effects with medications prescribed/recommended today, ER and return-to-clinic precautions discussed, patient verbalized understanding.    [name], [place] 03/12/24 1456

## 2024-03-12 NOTE — ED Triage Notes (Signed)
 Pt reports vaginal discharge with bad smell x 2 days. Requested STD's test.

## 2024-03-13 ENCOUNTER — Ambulatory Visit (HOSPITAL_COMMUNITY): Payer: Self-pay

## 2024-03-13 LAB — CERVICOVAGINAL ANCILLARY ONLY
Bacterial Vaginitis (gardnerella): POSITIVE — AB
Candida Glabrata: NEGATIVE
Candida Vaginitis: POSITIVE — AB
Chlamydia: NEGATIVE
Comment: NEGATIVE
Comment: NEGATIVE
Comment: NEGATIVE
Comment: NEGATIVE
Comment: NEGATIVE
Comment: NORMAL
Neisseria Gonorrhea: NEGATIVE
Trichomonas: NEGATIVE

## 2024-03-14 LAB — MISC LABCORP TEST (SEND OUT): Labcorp test code: 180076

## 2024-03-27 ENCOUNTER — Ambulatory Visit: Admitting: Nurse Practitioner

## 2024-03-27 VITALS — BP 102/70 | Wt 111.0 lb

## 2024-03-27 DIAGNOSIS — B9689 Other specified bacterial agents as the cause of diseases classified elsewhere: Secondary | ICD-10-CM

## 2024-03-27 DIAGNOSIS — B3731 Acute candidiasis of vulva and vagina: Secondary | ICD-10-CM

## 2024-03-27 DIAGNOSIS — N760 Acute vaginitis: Secondary | ICD-10-CM | POA: Diagnosis not present

## 2024-03-27 DIAGNOSIS — N898 Other specified noninflammatory disorders of vagina: Secondary | ICD-10-CM | POA: Diagnosis not present

## 2024-03-27 LAB — WET PREP FOR TRICH, YEAST, CLUE

## 2024-03-27 MED ORDER — METRONIDAZOLE 500 MG PO TABS: 500.0000 mg | ORAL_TABLET | Freq: Two times a day (BID) | ORAL | 0 refills | Status: DC

## 2024-03-27 MED ORDER — TERCONAZOLE 0.8 % VA CREA: 1.0000 | TOPICAL_CREAM | Freq: Every day | VAGINAL | 1 refills | Status: DC

## 2024-03-27 NOTE — Progress Notes (Signed)
   Acute Office Visit  Subjective:    Patient ID: [name], female    DOB: 2000/06/17, 23 y.o.   MRN: [id number]   HPI 23 y.o. presents today for green vaginal discharge, odor and irritation. Treated for yeast and BV at [facility] 03/12/24 with Diflucan  and PO Flagyl . Finished these 2 days ago. Negative for mycoplasma, GC, CT, trich. Had menses and symptoms returned. Also treated for yeast and BV in September.   Patient's last menstrual period was 03/21/2023 (exact date).    Review of Systems  Constitutional: Negative.   Genitourinary:  Positive for vaginal discharge and vaginal pain.       Vaginal odor       Objective:    Physical Exam Constitutional:      Appearance: Normal appearance.  Genitourinary:    General: Normal vulva.     Vagina: Vaginal discharge and erythema present.     BP 102/70 (BP Location: Left Arm, Patient Position: Sitting, Cuff Size: Normal)   Wt 111 lb (50.3 kg)   LMP 03/21/2023 (Exact Date)   BMI 20.97 kg/m  Wt Readings from Last 3 Encounters:  03/27/24 111 lb (50.3 kg)  01/13/24 113 lb (51.3 kg)  01/13/24 113 lb 12.8 oz (51.6 kg)        Patient informed chaperone available to be present for breast and pelvic exam. Patient has requested no chaperone to be present. Patient has been advised what will be completed during breast and pelvic exam.   Wet prep + yeast, + clue cells (+ odor)  Assessment & Plan:   Problem List Items Addressed This Visit   None Visit Diagnoses       Vaginal candidiasis    -  Primary   Relevant Medications      terconazole  (TERAZOL 3 ) 0.8 % vaginal cream     Bacterial vaginosis       Relevant Medications   metroNIDAZOLE  (FLAGYL ) 500 MG tablet   Vaginal discharge              Wet prep for TRICH, YEAST, CLUE   Plan: Diflucan  has been ineffective in the past for her. Terazol 0.8% x 3 nights (refill provided in case symptoms return after completing oral antibiotic). Flagyl  500 mg BID x 7 days. Recommend condom use,  avoiding harsh or scented soaps and body washes.   Return if symptoms worsen or fail to improve.    [name] DNP, 3:20 PM 03/27/2024

## 2024-04-12 ENCOUNTER — Other Ambulatory Visit

## 2024-04-12 ENCOUNTER — Ambulatory Visit (INDEPENDENT_AMBULATORY_CARE_PROVIDER_SITE_OTHER): Admitting: Nurse Practitioner

## 2024-04-12 ENCOUNTER — Encounter: Payer: Self-pay | Admitting: Nurse Practitioner

## 2024-04-12 VITALS — BP 116/76 | HR 97 | Ht 62.0 in | Wt 111.0 lb

## 2024-04-12 DIAGNOSIS — N83299 Other ovarian cyst, unspecified side: Secondary | ICD-10-CM | POA: Diagnosis not present

## 2024-04-12 DIAGNOSIS — R1030 Lower abdominal pain, unspecified: Secondary | ICD-10-CM

## 2024-04-12 NOTE — Progress Notes (Signed)
   Acute Office Visit  Subjective:    Patient ID: [name], female    DOB: [birth year], 23 y.o.   MRN: [id number]   HPI 23 y.o. presents today for lower abdominal pain x 2 days. Pain is 7/10, constant, sharp, takes breath away, worse with movement and laying flat. US  01/14/2024 showed 4.1 cm complex right ovarian cyst, otherwise unremarkable. Sexually active. Treated for yeast and BV 11/18. Started another course of Terazol yesterday because yeast symptoms returned after intercourse. Does not have regular bowel movements.   Patient's last menstrual period was 03/20/2024. Period Cycle (Days): 28 Period Duration (Days): 6 Period Pattern: Regular Menstrual Flow: Heavy Menstrual Control: Maxi pad, Tampon Menstrual Control Change Freq (Hours): 2-3 Dysmenorrhea: (!) Severe Dysmenorrhea Symptoms: Cramping, Diarrhea  Review of Systems  Constitutional: Negative.   Genitourinary:  Positive for pelvic pain and vaginal pain (Itching). Negative for dysuria, frequency, hematuria, menstrual problem, urgency, vaginal bleeding and vaginal discharge.       Objective:    Physical Exam Constitutional:      Appearance: Normal appearance.  Genitourinary:    General: Normal vulva.     Vagina: Normal.     Cervix: Normal.     Uterus: Tender.      Adnexa:        Right: Tenderness present. No mass.         Left: Tenderness present. No mass.       BP 116/76 (BP Location: Left Arm, Patient Position: Sitting, Cuff Size: Normal)   Pulse 97   Ht 5' 2 (1.575 m)   Wt 111 lb (50.3 kg)   LMP 03/20/2024   SpO2 99%   BMI 20.30 kg/m  Wt Readings from Last 3 Encounters:  04/12/24 111 lb (50.3 kg)  03/27/24 111 lb (50.3 kg)  01/13/24 113 lb (51.3 kg)        Kari Leaven, CMA present as chaperone.   UPT negative  UA: trace leukocytes, neg nitrites, neg blood, dark yellow/cloudy. Microscopic: wbc 0-5, rbc none, few bacteria  Assessment & Plan:   Problem List Items Addressed This Visit        Other   Lower abdominal pain   Relevant Orders   US  PELVIS TRANSVAGINAL NON-OB (TV ONLY) (Completed)   Urinalysis,Complete w/RFL Culture   Other Visit Diagnoses       Complex ovarian cyst    -  Primary   Relevant Orders   US  PELVIS TRANSVAGINAL NON-OB (TV ONLY) (Completed)      Vaginal ultrasound (comparison is made to 01/14/2024 ultrasound): Anteverted uterus, normal size and shape, pedunculated fibroid noted 1.8 cm.  Thin, symmetrical endometrium - 11 mm.  No masses or thickening seen.  Right ovary 15 x 14 mm complex hemorrhagic cyst (previously 4.1 cm) with positive perfusion.  Left ovary within normal limits with positive perfusion.  No adnexal masses, no free fluid.  Plan: Ultrasound reviewed. No evidence of torsion, complex cyst smaller in size. UPT negative, UA unremarkable. Recommend bowel prep to see if discomfort is related to constipation and/or gas. Will follow up with PCP if pain persists.   Return if symptoms worsen or fail to improve.    [name] DNP, 3:48 PM 04/12/2024

## 2024-04-14 LAB — URINALYSIS, COMPLETE W/RFL CULTURE
Bilirubin Urine: NEGATIVE
Glucose, UA: NEGATIVE
Hgb urine dipstick: NEGATIVE
Hyaline Cast: NONE SEEN /LPF
Nitrites, Initial: NEGATIVE
Protein, ur: NEGATIVE
RBC / HPF: NONE SEEN /HPF (ref 0–2)
Specific Gravity, Urine: 1.025 (ref 1.001–1.035)
pH: 6 (ref 5.0–8.0)

## 2024-04-14 LAB — URINE CULTURE
MICRO NUMBER:: 17316959
Result:: NO GROWTH
SPECIMEN QUALITY:: ADEQUATE

## 2024-04-14 LAB — CULTURE INDICATED

## 2024-04-15 ENCOUNTER — Ambulatory Visit: Payer: Self-pay | Admitting: Nurse Practitioner

## 2024-04-20 DIAGNOSIS — Z419 Encounter for procedure for purposes other than remedying health state, unspecified: Secondary | ICD-10-CM | POA: Diagnosis not present

## 2024-05-05 ENCOUNTER — Ambulatory Visit
Admission: EM | Admit: 2024-05-05 | Discharge: 2024-05-05 | Disposition: A | Discharge status: Home / Self Care | Attending: Family Medicine | Admitting: Family Medicine

## 2024-05-05 ENCOUNTER — Encounter: Payer: Self-pay | Admitting: Emergency Medicine

## 2024-05-05 DIAGNOSIS — N898 Other specified noninflammatory disorders of vagina: Secondary | ICD-10-CM | POA: Insufficient documentation

## 2024-05-05 DIAGNOSIS — R300 Dysuria: Secondary | ICD-10-CM | POA: Insufficient documentation

## 2024-05-05 DIAGNOSIS — Z113 Encounter for screening for infections with a predominantly sexual mode of transmission: Secondary | ICD-10-CM | POA: Diagnosis not present

## 2024-05-05 DIAGNOSIS — N3001 Acute cystitis with hematuria: Secondary | ICD-10-CM | POA: Insufficient documentation

## 2024-05-05 LAB — POCT URINE DIPSTICK
Bilirubin, UA: NEGATIVE
Glucose, UA: NEGATIVE mg/dL
Ketones, POC UA: NEGATIVE mg/dL
Nitrite, UA: NEGATIVE
POC PROTEIN,UA: 100 — AB
Spec Grav, UA: >=1.03 — AB
Urobilinogen, UA: 1 U/dL
pH, UA: 6

## 2024-05-05 LAB — POCT URINE PREGNANCY: Preg Test, Ur: NEGATIVE

## 2024-05-05 MED ORDER — FLUCONAZOLE 150 MG PO TABS: 150.0000 mg | ORAL_TABLET | Freq: Every day | ORAL | 0 refills | Status: AC

## 2024-05-05 MED ORDER — NITROFURANTOIN MONOHYD MACRO 100 MG PO CAPS: 100.0000 mg | ORAL_CAPSULE | Freq: Two times a day (BID) | ORAL | 0 refills | Status: AC

## 2024-05-05 NOTE — Discharge Instructions (Signed)
 The clinical contact you with results of the urine culture and vaginal swab/STD testing done today are positive.  Start Keflex antibiotic twice daily for 7 days for UTI.  You may take Diflucan  as prescribed for yeast infection symptoms.  Lots of rest and fluids.  Follow-up with your PCP or gynecologist in 2 to 3 days for recheck.  Please go to the ER for any worsening symptoms.  Hope you feel better soon!
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
## 2024-05-05 NOTE — ED Triage Notes (Signed)
 Patient has already had a menstrual cycle last  day was the 15th and on the 26 she has started back bleeding.  Patient has taken an pregnancy test and it was negative.  Patient has been cramping .  Patient has been feeling out of it and tired.  Patient wants to be tested

## 2024-05-06 LAB — URINE CULTURE: Culture: <10000 — AB

## 2024-05-07 ENCOUNTER — Ambulatory Visit (HOSPITAL_COMMUNITY): Payer: Self-pay

## 2024-05-07 LAB — CERVICOVAGINAL ANCILLARY ONLY
Bacterial Vaginitis (gardnerella): POSITIVE — AB
Candida Glabrata: NEGATIVE
Candida Vaginitis: POSITIVE — AB
Chlamydia: NEGATIVE
Comment: NEGATIVE
Comment: NEGATIVE
Comment: NEGATIVE
Comment: NEGATIVE
Comment: NEGATIVE
Comment: NORMAL
Neisseria Gonorrhea: NEGATIVE
Trichomonas: NEGATIVE

## 2024-05-08 NOTE — Progress Notes (Deleted)
" ° °  23 y.o. G90P0000 female here for ***. Single.  Patient's last menstrual period was 04/23/2024 (exact date).    She reports {symptoms; vaginitis:19577}. Symptoms have been present for ***. She has tried ***. Urine sample provided: ***  Birth control: OCP Sexually active: ***    GYN HISTORY: ***  OB History  Gravida Para Term Preterm AB Living  1 0 0 0 0 0  SAB IAB Ectopic Multiple Live Births  0 0 0 0 0    # Outcome Date GA Lbr Len/2nd Weight Sex Type Anes PTL Lv  1 Gravida            Past Medical History:  Diagnosis Date   Screen for STD (sexually transmitted disease) 02/28/2017   Seasonal allergies    Vaginal bleeding 09/17/2016   Vulvovaginal candidiasis 11/15/2014   Past Surgical History:  Procedure Laterality Date   APPENDECTOMY     LAPAROSCOPIC APPENDECTOMY N/A 11/08/2014   Procedure: APPENDECTOMY LAPAROSCOPIC;  Surgeon: Julietta Millman, MD;  Location: [facility] OR;  Service: Pediatrics;  Laterality: N/A;   Medications Ordered Prior to Encounter[1] Allergies[2]    PE There were no vitals filed for this visit. There is no height or weight on file to calculate BMI.  Physical Exam    Assessment and Plan:        There are no diagnoses linked to this encounter.  Clotilda FORBES Pa, CMA      [1]  Current Outpatient Medications on File Prior to Visit  Medication Sig Dispense Refill   BIOTIN PO Take by mouth.     calcium-vitamin D (OSCAL WITH D) 500-5 MG-MCG tablet Take 1 tablet by mouth.     cetirizine  (ZYRTEC ) 10 MG tablet Take 10 mg by mouth daily.     nitrofurantoin , macrocrystal-monohydrate, (MACROBID ) 100 MG capsule Take 1 capsule (100 mg total) by mouth 2 (two) times daily for 7 days. 14 capsule 0   norelgestromin -ethinyl estradiol  (XULANE) 150-35 MCG/24HR transdermal patch Place 1 patch onto the skin once a week. (Patient not taking: Reported on 04/12/2024) 3 patch 12   terconazole  (TERAZOL 3 ) 0.8 % vaginal cream Place 1 applicator vaginally at bedtime. 20 g  1   vitamin B-12 (CYANOCOBALAMIN) 500 MCG tablet Take 500 mcg by mouth daily.     No current facility-administered medications on file prior to visit.  [2]  Allergies Allergen Reactions   Penicillins Rash   Amoxicillin    Tramadol     "

## 2024-05-09 ENCOUNTER — Ambulatory Visit: Admitting: Obstetrics and Gynecology

## 2024-05-09 MED ORDER — METRONIDAZOLE 0.75 % VA GEL: 1.0000 | Freq: Every day | VAGINAL | 0 refills | Status: AC

## 2024-05-10 ENCOUNTER — Other Ambulatory Visit: Payer: Self-pay

## 2024-05-10 ENCOUNTER — Encounter (HOSPITAL_BASED_OUTPATIENT_CLINIC_OR_DEPARTMENT_OTHER): Payer: Self-pay

## 2024-05-10 ENCOUNTER — Ambulatory Visit: Payer: Self-pay

## 2024-05-10 ENCOUNTER — Emergency Department (HOSPITAL_BASED_OUTPATIENT_CLINIC_OR_DEPARTMENT_OTHER): Admission: EM | Admit: 2024-05-10 | Discharge: 2024-05-10 | Disposition: A | Discharge status: Home / Self Care

## 2024-05-10 DIAGNOSIS — B379 Candidiasis, unspecified: Secondary | ICD-10-CM | POA: Insufficient documentation

## 2024-05-10 DIAGNOSIS — N939 Abnormal uterine and vaginal bleeding, unspecified: Secondary | ICD-10-CM | POA: Insufficient documentation

## 2024-05-10 DIAGNOSIS — N760 Acute vaginitis: Secondary | ICD-10-CM | POA: Diagnosis not present

## 2024-05-10 DIAGNOSIS — B9689 Other specified bacterial agents as the cause of diseases classified elsewhere: Secondary | ICD-10-CM | POA: Diagnosis not present

## 2024-05-10 MED ORDER — METRONIDAZOLE 500 MG PO TABS: 500.0000 mg | ORAL_TABLET | Freq: Three times a day (TID) | ORAL | 0 refills | Status: AC

## 2024-05-10 MED ORDER — FLUCONAZOLE 100 MG PO TABS: 100.0000 mg | ORAL_TABLET | Freq: Every day | ORAL | 0 refills | Status: AC

## 2024-05-10 NOTE — ED Provider Notes (Signed)
 " [place] EMERGENCY DEPARTMENT AT [facility] Provider Note   CSN: [id number] Arrival date & time: 05/10/24  1644     Patient presents with: Vaginal Bleeding   [name] is a 24 y.o. female.   24 year old female presents for evaluation of vaginal bleeding.  Was seen at urgent care for this the other day and diagnosed with BV UTI and a yeast infection.  She was told later to stop taking the medication for her UTI as her culture came back negative.  States she is still having some vaginal bleeding.  She had a negative pregnancy test at that time.  She states that she already had her period earlier this month.   Vaginal Bleeding Associated symptoms: no abdominal pain, no back pain, no dysuria and no fever        Prior to Admission medications  Medication Sig Start Date End Date Taking? Authorizing Provider  fluconazole  (DIFLUCAN ) 100 MG tablet Take 1 tablet (100 mg total) by mouth daily for 3 days. 05/10/24 05/13/24 Yes Skii Cleland L, DO  metroNIDAZOLE  (FLAGYL ) 500 MG tablet Take 1 tablet (500 mg total) by mouth 3 (three) times daily for 7 days. 05/10/24 05/17/24 Yes Kindrick Lankford L, DO  BIOTIN PO Take by mouth.    [provider]  calcium-vitamin D (OSCAL WITH D) 500-5 MG-MCG tablet Take 1 tablet by mouth.    [provider]  cetirizine  (ZYRTEC ) 10 MG tablet Take 10 mg by mouth daily.    [provider]  metroNIDAZOLE  (METROGEL ) 0.75 % vaginal gel Place 1 Applicatorful vaginally at bedtime for 5 days. 05/09/24 05/14/24  Vonna Sharlet POUR, MD  nitrofurantoin , macrocrystal-monohydrate, (MACROBID ) 100 MG capsule Take 1 capsule (100 mg total) by mouth 2 (two) times daily for 7 days. 05/05/24 05/12/24  Mayer, Jodi R, NP  norelgestromin -ethinyl estradiol  (XULANE) 150-35 MCG/24HR transdermal patch Place 1 patch onto the skin once a week. Patient not taking: Reported on 04/12/2024 01/03/24   Theophilus Pagan, MD  terconazole  (TERAZOL 3 ) 0.8 % vaginal cream  Place 1 applicator vaginally at bedtime. 03/27/24   Prentiss Annabella LABOR, NP  vitamin B-12 (CYANOCOBALAMIN) 500 MCG tablet Take 500 mcg by mouth daily.    [provider]    Allergies: Penicillins, Amoxicillin, and Tramadol     Review of Systems  Constitutional:  Negative for chills and fever.  HENT:  Negative for ear pain and sore throat.   Eyes:  Negative for pain and visual disturbance.  Respiratory:  Negative for cough and shortness of breath.   Cardiovascular:  Negative for chest pain and palpitations.  Gastrointestinal:  Negative for abdominal pain and vomiting.  Genitourinary:  Positive for vaginal bleeding. Negative for dysuria and hematuria.  Musculoskeletal:  Negative for arthralgias and back pain.  Skin:  Negative for color change and rash.  Neurological:  Negative for seizures and syncope.  All other systems reviewed and are negative.   Updated Vital Signs BP 100/65   Pulse 96   Temp 98.2 F (36.8 C) (Oral)   Resp 16   Wt 50.8 kg   LMP 04/23/2024 (Exact Date)   SpO2 100%   BMI 20.49 kg/m   Physical Exam Vitals and nursing note reviewed.  Constitutional:      General: She is not in acute distress.    Appearance: She is well-developed.  HENT:     Head: Normocephalic and atraumatic.  Eyes:     Conjunctiva/sclera: Conjunctivae normal.  Cardiovascular:     Rate and Rhythm:  Normal rate and regular rhythm.     Heart sounds: No murmur heard. Pulmonary:     Effort: Pulmonary effort is normal. No respiratory distress.     Breath sounds: Normal breath sounds.  Abdominal:     Palpations: Abdomen is soft.     Tenderness: There is no abdominal tenderness.  Musculoskeletal:        General: No swelling.     Cervical back: Neck supple.  Skin:    General: Skin is warm and dry.     Capillary Refill: Capillary refill takes less than 2 seconds.  Neurological:     Mental Status: She is alert.  Psychiatric:        Mood and Affect: Mood normal.     (all labs  ordered are listed, but only abnormal results are displayed) Labs Reviewed - No data to display  EKG: None  Radiology: No results found.   Procedures   Medications Ordered in the ED - No data to display                                  Medical Decision Making Patient offered blood work and ultrasound declined and she states she would rather follow-up with her OB/GYN for this as she has an appointment on January 13.  Is requesting Flagyl  and Diflucan  be switched to pills from suppositories and I will send prescriptions for this.  I offered to get her an outpatient ultrasound scheduled but she declined this as well.  Advised to follow-up with OB/GYN and otherwise return to the ER if she changes her mind or for any new or worsening symptoms.  She feels comfortable being discharged.  Problems Addressed: Bacterial vaginosis: acute illness or injury Vaginal bleeding: undiagnosed new problem with uncertain prognosis Yeast infection: acute illness or injury  Amount and/or Complexity of Data Reviewed External Data Reviewed: notes.    Details: Urgent care records reviewed and patient was recently seen there on 12-27 and diagnosed with UTI, BV and yeast infection  Risk OTC drugs. Prescription drug management.     Final diagnoses:  Vaginal bleeding  Bacterial vaginosis  Yeast infection    ED Discharge Orders          Ordered    metroNIDAZOLE  (FLAGYL ) 500 MG tablet  3 times daily        05/10/24 1854    fluconazole  (DIFLUCAN ) 100 MG tablet  Daily        05/10/24 1854               Gennaro Duwaine CROME, DO 05/10/24 1910  "

## 2024-05-10 NOTE — ED Triage Notes (Signed)
 Presents for vaginal bleeding for 5 days. Was seen at [facility] on 12/27 and dx with BV. LMP 12/15.

## 2024-05-10 NOTE — ED Notes (Signed)

## 2024-05-10 NOTE — Discharge Instructions (Addendum)
 Follow-up with your OB/GYN at your scheduled appointment.  Take your Flagyl  and fluconazole  as prescribed.  These are both a pill forms of the other antibiotics/yeast medicine you were given.  Return to the ER for any new or worsening symptoms or if you change your mind on ultrasound.

## 2024-05-11 ENCOUNTER — Ambulatory Visit: Payer: Self-pay

## 2024-05-15 ENCOUNTER — Encounter: Payer: Self-pay | Admitting: Nurse Practitioner

## 2024-05-15 ENCOUNTER — Ambulatory Visit: Admitting: Nurse Practitioner

## 2024-05-15 VITALS — BP 108/70 | HR 99 | Ht 62.25 in | Wt 115.0 lb

## 2024-05-15 DIAGNOSIS — N926 Irregular menstruation, unspecified: Secondary | ICD-10-CM

## 2024-05-15 DIAGNOSIS — N760 Acute vaginitis: Secondary | ICD-10-CM

## 2024-05-15 DIAGNOSIS — Z113 Encounter for screening for infections with a predominantly sexual mode of transmission: Secondary | ICD-10-CM

## 2024-05-15 NOTE — Progress Notes (Signed)
" ° °  Acute Office Visit  Subjective:    Patient ID: [name], female    DOB: January 08, 2001, 24 y.o.   MRN: [id number]   HPI 24 y.o. G1P0010 presents today for vaginal bleeding. Cycle 12/10-12/15, then again 12/26 and still bleeding today. Bleeding ranges from brown to red. US  04/12/24 to follow up on hemorrhagia cyst showed 11 mm lining, small pedunculated fibroid 1.8 cm, cyst decreased in cyst 15 x 14 mm (4.1 cm 01/14/24). Not on hormonal contraception. Cycles are normally regular. Recurrent vaginitis since early September 2025. Treated for yeast and BV 01/14/24, 02/02/24, 03/12/24, 03/27/24, 05/05/24, switched to oral Flagyl  05/11/23 since she was bleeding, currently on day 4. Treated with Diflucan  and PO Flagyl  each time. Neg ureaplasma/mycoplasma in November. Neg gonorrhea, chlamydia and trich, neg UPT 12/27. Has a couple of partners, uses condoms some. Would like HIV, RPR today.   Patient's last menstrual period was 04/23/2024 (exact date).    Review of Systems  Constitutional: Negative.   Genitourinary:  Positive for menstrual problem, vaginal bleeding and vaginal discharge.       Objective:    Physical Exam Constitutional:      Appearance: Normal appearance.     BP 108/70   Pulse 99   Ht 5' 2.25 (1.581 m)   Wt 115 lb (52.2 kg)   LMP 04/23/2024 (Exact Date)   SpO2 99%   BMI 20.87 kg/m  Wt Readings from Last 3 Encounters:  05/15/24 115 lb (52.2 kg)  05/10/24 112 lb (50.8 kg)  04/12/24 111 lb (50.3 kg)       Assessment & Plan:   Problem List Items Addressed This Visit   None Visit Diagnoses       Irregular bleeding    -  Primary   Relevant Orders   TSH     Screening examination for STD (sexually transmitted disease)       Relevant Orders   RPR W/RFLX TO RPR TITER, TREPONEMAL AB, SCREEN AND DIAGNOSIS   HIV Antibody (routine testing w rflx)     Recurrent vaginitis          Plan: TSH pending. Finish current course of antibiotic and antifungal. If symptoms return  recommend yeast culture and long-term course of Metrogel . Also discussed current recommendations for partners to be treated for BV. Women's probiotic, boric acid after intercourse, strict condom use. Offered suppressive treatment for bleeding. Declines today and wants to wait to see if bleeding stops on it's own after treating infections.  Return if symptoms worsen or fail to improve.  I personally spent a total of 30 minutes in the care of the patient today including preparing to see the patient, getting/reviewing separately obtained history, performing a medically appropriate exam/evaluation, counseling and educating, placing orders, documenting clinical information in the EHR, independently interpreting results, and communicating results.     Michele DELENA Shutter DNP, 2:29 PM 05/15/2024 "

## 2024-05-16 ENCOUNTER — Ambulatory Visit: Admitting: Nurse Practitioner

## 2024-05-16 ENCOUNTER — Ambulatory Visit: Payer: Self-pay | Admitting: Nurse Practitioner

## 2024-05-16 LAB — HIV ANTIBODY (ROUTINE TESTING W REFLEX)
HIV 1&2 Ab, 4th Generation: NONREACTIVE
HIV FINAL INTERPRETATION: NEGATIVE

## 2024-05-16 LAB — SYPHILIS: RPR W/REFLEX TO RPR TITER AND TREPONEMAL ANTIBODIES, TRADITIONAL SCREENING AND DIAGNOSIS ALGORITHM: RPR Ser Ql: NONREACTIVE

## 2024-05-16 LAB — TSH: TSH: 1.24 m[IU]/L

## 2024-05-22 ENCOUNTER — Ambulatory Visit: Admitting: Nurse Practitioner
